# Patient Record
Sex: Female | Born: 1985 | Marital: Married | Smoking: Never smoker
Health system: Southern US, Community
[De-identification: ages and names within clinical notes are randomized; demographics above are authoritative.]

## PROBLEM LIST (undated history)

## (undated) DIAGNOSIS — Z789 Other specified health status: Secondary | ICD-10-CM

## (undated) HISTORY — DX: Other specified health status: Z78.9

---

## 2021-09-09 DIAGNOSIS — Z23 Encounter for immunization: Secondary | ICD-10-CM | POA: Diagnosis not present

## 2021-09-09 DIAGNOSIS — Z0289 Encounter for other administrative examinations: Secondary | ICD-10-CM | POA: Diagnosis not present

## 2021-09-09 DIAGNOSIS — Z111 Encounter for screening for respiratory tuberculosis: Secondary | ICD-10-CM | POA: Diagnosis not present

## 2021-09-09 DIAGNOSIS — Z114 Encounter for screening for human immunodeficiency virus [HIV]: Secondary | ICD-10-CM | POA: Diagnosis not present

## 2021-10-18 ENCOUNTER — Ambulatory Visit
Admission: RE | Admit: 2021-10-18 | Discharge: 2021-10-18 | Disposition: A | Discharge status: Home / Self Care | Payer: No Typology Code available for payment source | Source: Ambulatory Visit | Attending: Obstetrics and Gynecology | Admitting: Obstetrics and Gynecology

## 2021-10-18 ENCOUNTER — Other Ambulatory Visit: Payer: Self-pay

## 2021-10-18 ENCOUNTER — Other Ambulatory Visit: Payer: Self-pay | Admitting: Obstetrics and Gynecology

## 2021-10-18 DIAGNOSIS — R9389 Abnormal findings on diagnostic imaging of other specified body structures: Secondary | ICD-10-CM

## 2021-10-25 DIAGNOSIS — Z23 Encounter for immunization: Secondary | ICD-10-CM | POA: Diagnosis not present

## 2022-01-18 DIAGNOSIS — Z23 Encounter for immunization: Secondary | ICD-10-CM | POA: Diagnosis not present

## 2022-01-24 NOTE — Congregational Nurse Program (Unsigned)
Patient came in to establish care with PCP. Appointment made at same provider location as husband for April 22, 2022 10:20 am. Patients state they are comfortable taking the bus to appointment.  ? ?Michele Gomez H ? ?

## 2022-02-02 ENCOUNTER — Emergency Department (HOSPITAL_COMMUNITY): Payer: Medicaid Other

## 2022-02-02 ENCOUNTER — Encounter (HOSPITAL_COMMUNITY): Payer: Self-pay

## 2022-02-02 ENCOUNTER — Other Ambulatory Visit (HOSPITAL_COMMUNITY): Payer: Self-pay

## 2022-02-02 ENCOUNTER — Other Ambulatory Visit: Payer: Self-pay

## 2022-02-02 ENCOUNTER — Emergency Department (HOSPITAL_COMMUNITY)
Admission: EM | Admit: 2022-02-02 | Discharge: 2022-02-02 | Disposition: A | Discharge status: Home / Self Care | Payer: Medicaid Other | Attending: Emergency Medicine | Admitting: Emergency Medicine

## 2022-02-02 DIAGNOSIS — Y92410 Unspecified street and highway as the place of occurrence of the external cause: Secondary | ICD-10-CM | POA: Diagnosis not present

## 2022-02-02 DIAGNOSIS — R930 Abnormal findings on diagnostic imaging of skull and head, not elsewhere classified: Secondary | ICD-10-CM | POA: Insufficient documentation

## 2022-02-02 DIAGNOSIS — S59902A Unspecified injury of left elbow, initial encounter: Secondary | ICD-10-CM | POA: Diagnosis not present

## 2022-02-02 DIAGNOSIS — S79912A Unspecified injury of left hip, initial encounter: Secondary | ICD-10-CM | POA: Diagnosis not present

## 2022-02-02 DIAGNOSIS — R55 Syncope and collapse: Secondary | ICD-10-CM | POA: Insufficient documentation

## 2022-02-02 DIAGNOSIS — M79622 Pain in left upper arm: Secondary | ICD-10-CM | POA: Insufficient documentation

## 2022-02-02 DIAGNOSIS — S8992XA Unspecified injury of left lower leg, initial encounter: Secondary | ICD-10-CM | POA: Diagnosis not present

## 2022-02-02 DIAGNOSIS — M542 Cervicalgia: Secondary | ICD-10-CM | POA: Diagnosis not present

## 2022-02-02 DIAGNOSIS — M25552 Pain in left hip: Secondary | ICD-10-CM | POA: Diagnosis not present

## 2022-02-02 DIAGNOSIS — R519 Headache, unspecified: Secondary | ICD-10-CM | POA: Diagnosis present

## 2022-02-02 DIAGNOSIS — S3993XA Unspecified injury of pelvis, initial encounter: Secondary | ICD-10-CM | POA: Diagnosis not present

## 2022-02-02 DIAGNOSIS — S199XXA Unspecified injury of neck, initial encounter: Secondary | ICD-10-CM | POA: Diagnosis not present

## 2022-02-02 DIAGNOSIS — S299XXA Unspecified injury of thorax, initial encounter: Secondary | ICD-10-CM | POA: Diagnosis not present

## 2022-02-02 DIAGNOSIS — M545 Low back pain, unspecified: Secondary | ICD-10-CM | POA: Diagnosis not present

## 2022-02-02 DIAGNOSIS — S0990XA Unspecified injury of head, initial encounter: Secondary | ICD-10-CM | POA: Diagnosis not present

## 2022-02-02 DIAGNOSIS — M25562 Pain in left knee: Secondary | ICD-10-CM | POA: Insufficient documentation

## 2022-02-02 DIAGNOSIS — S3992XA Unspecified injury of lower back, initial encounter: Secondary | ICD-10-CM | POA: Diagnosis not present

## 2022-02-02 DIAGNOSIS — S4992XA Unspecified injury of left shoulder and upper arm, initial encounter: Secondary | ICD-10-CM | POA: Diagnosis not present

## 2022-02-02 LAB — I-STAT CHEM 8, ED
BUN: 10 mg/dL (ref 6–20)
Calcium, Ion: 1.17 mmol/L (ref 1.15–1.40)
Chloride: 105 mmol/L (ref 98–111)
Creatinine, Ser: 0.6 mg/dL (ref 0.44–1.00)
Glucose, Bld: 96 mg/dL (ref 70–99)
HCT: 39 % (ref 36.0–46.0)
Hemoglobin: 13.3 g/dL (ref 12.0–15.0)
Potassium: 3.9 mmol/L (ref 3.5–5.1)
Sodium: 140 mmol/L (ref 135–145)
TCO2: 24 mmol/L (ref 22–32)

## 2022-02-02 LAB — I-STAT BETA HCG BLOOD, ED (MC, WL, AP ONLY): I-stat hCG, quantitative: <5 m[IU]/mL (ref <5)

## 2022-02-02 MED ORDER — IBUPROFEN 600 MG PO TABS
600.0000 mg | ORAL_TABLET | Freq: Four times a day (QID) | ORAL | 0 refills | Status: DC | PRN
  Filled 2022-02-02: qty 30, 8d supply, fill #0

## 2022-02-02 MED ORDER — IBUPROFEN 800 MG PO TABS
800.0000 mg | ORAL_TABLET | Freq: Once | ORAL | Status: AC
Start: 2022-02-02 — End: 2022-02-02
  Administered 2022-02-02: 800 mg via ORAL
  Filled 2022-02-02: qty 1

## 2022-02-02 MED ORDER — ACETAMINOPHEN 325 MG PO TABS
650.0000 mg | ORAL_TABLET | Freq: Once | ORAL | Status: AC
  Administered 2022-02-02: 650 mg via ORAL
  Filled 2022-02-02: qty 2

## 2022-02-02 MED ORDER — ACETAMINOPHEN 500 MG PO TABS
500.0000 mg | ORAL_TABLET | Freq: Four times a day (QID) | ORAL | 0 refills | Status: DC | PRN
  Filled 2022-02-02: qty 30, 8d supply, fill #0
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
## 2022-02-02 NOTE — Discharge Planning (Signed)
Michele Gomez J. [name], RN, BSN, [place] [phone] ? ?RNCM set up appointment with [name] on 6/20 @1045 .  Spoke with pt at bedside and advised to please arrive 15 min early and take a picture ID and your current medications.  Pt verbalizes understanding of keeping appointment. ? ?
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
## 2022-02-02 NOTE — ED Notes (Signed)
Patient transported to CT 

## 2022-02-02 NOTE — ED Notes (Signed)
Patient remains in CT 

## 2022-02-02 NOTE — ED Notes (Signed)
Pt was able to ambulate to the restroom.  
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
## 2022-02-02 NOTE — ED Notes (Signed)
Ambulatory to bathroom. Gait steady. No signs of distress. PO fluids tolerated well ? ?

## 2022-02-02 NOTE — Discharge Instructions (Addendum)
Return immediately if she becomes less responsive, has increased headache, weakness, or other new symptoms. ?Please keep appointment with Dr. Wynetta Emery as scheduled ?
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
## 2022-02-03 ENCOUNTER — Telehealth: Payer: Self-pay

## 2022-02-03 NOTE — Telephone Encounter (Signed)
Transition Care Management Unsuccessful Follow-up Telephone Call ? ?Date of discharge and from where:  02/02/2022-[place]  ? ?Attempts:  1st Attempt ? ?Reason for unsuccessful TCM follow-up call:  Left voice message ? ?  ?

## 2022-02-04 NOTE — Telephone Encounter (Signed)
Transition Care Management Unsuccessful Follow-up Telephone Call ? ?Date of discharge and from where:  02/02/2022-[place]  ? ?Attempts:  2nd Attempt ? ?Reason for unsuccessful TCM follow-up call:  Left voice message ? ?  ?

## 2022-02-08 NOTE — Telephone Encounter (Signed)
Transition Care Management Unsuccessful Follow-up Telephone Call ? ?Date of discharge and from where:  02/02/2022-[place]  ? ?Attempts:  3rd Attempt ? ?Reason for unsuccessful TCM follow-up call:  Unable to reach patient ? ? ? ?

## 2022-02-10 ENCOUNTER — Other Ambulatory Visit (HOSPITAL_COMMUNITY): Payer: Self-pay
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
## 2022-04-22 ENCOUNTER — Ambulatory Visit (INDEPENDENT_AMBULATORY_CARE_PROVIDER_SITE_OTHER): Payer: Medicaid Other | Admitting: Family

## 2022-04-22 ENCOUNTER — Encounter: Payer: Self-pay | Admitting: Family

## 2022-04-22 ENCOUNTER — Other Ambulatory Visit (HOSPITAL_COMMUNITY): Payer: Self-pay

## 2022-04-22 ENCOUNTER — Other Ambulatory Visit: Payer: Self-pay

## 2022-04-22 VITALS — BP 135/86 | HR 108 | Temp 98.3°F | Resp 16 | Ht 64.45 in | Wt 124.7 lb

## 2022-04-22 DIAGNOSIS — Z7689 Persons encountering health services in other specified circumstances: Secondary | ICD-10-CM | POA: Diagnosis not present

## 2022-04-22 DIAGNOSIS — M792 Neuralgia and neuritis, unspecified: Secondary | ICD-10-CM | POA: Diagnosis not present

## 2022-04-22 DIAGNOSIS — R930 Abnormal findings on diagnostic imaging of skull and head, not elsewhere classified: Secondary | ICD-10-CM

## 2022-04-22 DIAGNOSIS — K219 Gastro-esophageal reflux disease without esophagitis: Secondary | ICD-10-CM

## 2022-04-22 DIAGNOSIS — M545 Low back pain, unspecified: Secondary | ICD-10-CM

## 2022-04-22 DIAGNOSIS — Z789 Other specified health status: Secondary | ICD-10-CM

## 2022-04-22 MED ORDER — OMEPRAZOLE 20 MG PO CPDR
20.0000 mg | DELAYED_RELEASE_CAPSULE | Freq: Every day | ORAL | 3 refills | Status: DC
  Filled 2022-04-22: qty 30, 30d supply, fill #0

## 2022-04-22 MED ORDER — OMEPRAZOLE 20 MG PO CPDR: 20.0000 mg | DELAYED_RELEASE_CAPSULE | Freq: Every day | ORAL | 3 refills | Status: DC

## 2022-04-22 MED ORDER — DULOXETINE HCL 20 MG PO CPEP
20.0000 mg | ORAL_CAPSULE | Freq: Every day | ORAL | 0 refills | Status: DC
  Filled 2022-04-22: qty 30, 30d supply, fill #0

## 2022-04-22 MED ORDER — DULOXETINE HCL 20 MG PO CPEP: 20.0000 mg | ORAL_CAPSULE | Freq: Every day | ORAL | 0 refills | Status: DC

## 2022-04-22 NOTE — Patient Instructions (Signed)
Thank you for choosing Primary Care at [facility] for your medical home!    Michele Gomez was seen by Rema Fendt, NP today.   Jenkins Rouge Schwall's primary care provider is Ricky Stabs, NP.   For the best care possible,  you should try to see Ricky Stabs, NP whenever you come to office.   We look forward to seeing you again soon!  If you have any questions about your visit today,  please call us at [phone]  Or feel free to reach your provider via MyChart.    Keeping you healthy   Get these tests Blood pressure- Have your blood pressure checked once a year by your healthcare provider.  Normal blood pressure is 120/80. Weight- Have your body mass index (BMI) calculated to screen for obesity.  BMI is a measure of body fat based on height and weight. You can also calculate your own BMI at https://www.west-esparza.com/. Cholesterol- Have your cholesterol checked regularly starting at age 71, sooner may be necessary if you have diabetes, high blood pressure, if a family member developed heart diseases at an early age or if you smoke.  Chlamydia, HIV, and other sexual transmitted disease- Get screened each year until the age of 26 then within three months of each new sexual partner. Diabetes- Have your blood sugar checked regularly if you have high blood pressure, high cholesterol, a family history of diabetes or if you are overweight.   Get these vaccines Flu shot- Every fall. Tetanus shot- Every 10 years. Menactra- Single dose; prevents meningitis.   Take these steps Don't smoke- If you do smoke, ask your healthcare provider about quitting. For tips on how to quit, go to www.smokefree.gov or call 1-800-QUIT-NOW. Be physically active- Exercise 5 days a week for at least 30 minutes.  If you are not already physically active start slow and gradually work up to 30 minutes of moderate physical activity.  Examples of moderate activity include walking briskly, mowing the yard, dancing,  swimming bicycling, etc. Eat a healthy diet- Eat a variety of healthy foods such as fruits, vegetables, low fat milk, low fat cheese, yogurt, lean meats, poultry, fish, beans, tofu, etc.  For more information on healthy eating, go to www.thenutritionsource.org Drink alcohol in moderation- Limit alcohol intake two drinks or less a day.  Never drink and drive. Dentist- Brush and floss teeth twice daily; visit your dentis twice a year. Depression-Your emotional health is as important as your physical health.  If you're feeling down, losing interest in things you normally enjoy please talk with your healthcare provider. Gun Safety- If you keep a gun in your home, keep it unloaded and with the safety lock on.  Bullets should be stored separately. Helmet use- Always wear a helmet when riding a motorcycle, bicycle, rollerblading or skateboarding. Safe sex- If you may be exposed to a sexually transmitted infection, use a condom Seat belts- Seat bels can save your life; always wear one. Smoke/Carbon Monoxide detectors- These detectors need to be installed on the appropriate level of your home.  Replace batteries at least once a year. Skin Cancer- When out in the sun, cover up and use sunscreen SPF 15 or higher. Violence- If anyone is threatening or hurting you, please tell your healthcare provider.

## 2022-04-22 NOTE — Progress Notes (Signed)
Pt presents to establish care, having issues w/ acid reflux  Having complaints of burning heels of feet and pain when walking

## 2022-05-11 ENCOUNTER — Telehealth: Payer: Self-pay | Admitting: Family

## 2022-05-17 NOTE — Progress Notes (Signed)
Patient ID: Michele Gomez, female    DOB: July 04, 1986  MRN: [id number]  CC: Annual Physical Exam  Subjective: Michele Gomez is a 36 y.o. female who presents for annual physical exam.  Her concerns today include:  None.    There are no problems to display for this patient.    Current Outpatient Medications on File Prior to Visit  Medication Sig Dispense Refill   acetaminophen (TYLENOL) 500 MG tablet Take 1 tablet (500 mg total) by mouth every 6 (six) hours as needed. 30 tablet 0   DULoxetine (CYMBALTA) 20 MG capsule Take 1 capsule (20 mg total) by mouth daily. 30 capsule 0   ibuprofen (ADVIL) 600 MG tablet Take 1 tablet (600 mg total) by mouth every 6 (six) hours as needed. 30 tablet 0   omeprazole (PRILOSEC) 20 MG capsule Take 1 capsule (20 mg total) by mouth daily. 30 capsule 3   No current facility-administered medications on file prior to visit.    No Known Allergies  Social History   Socioeconomic History   Marital status: Married    Spouse name: Not on file   Number of children: Not on file   Years of education: Not on file   Highest education level: Not on file  Occupational History   Not on file  Tobacco Use   Smoking status: Never    Passive exposure: Never   Smokeless tobacco: Never  Substance and Sexual Activity   Alcohol use: Never   Drug use: Never   Sexual activity: Yes  Other Topics Concern   Not on file  Social History Narrative   Not on file   Social Determinants of Health   Financial Resource Strain: Not on file  Food Insecurity: Not on file  Transportation Needs: Not on file  Physical Activity: Not on file  Stress: Not on file  Social Connections: Not on file  Intimate Partner Violence: Not on file    No family history on file.  No past surgical history on file.  ROS: Review of Systems Negative except as stated above  PHYSICAL EXAM: BP 137/88 (BP Location: Right Arm, Patient Position: Sitting, Cuff Size:  Normal)   Pulse 82   Temp 98.3 F (36.8 C)   Resp 16   Ht 5' 4.45" (1.637 m)   Wt 123 lb (55.8 kg)   SpO2 98%   BMI 20.82 kg/m   Physical Exam HENT:     Head: Normocephalic and atraumatic.     Right Ear: Tympanic membrane, ear canal and external ear normal.     Left Ear: Tympanic membrane, ear canal and external ear normal.     Nose: Nose normal.     Mouth/Throat:     Mouth: Mucous membranes are moist.     Pharynx: Oropharynx is clear.  Eyes:     Extraocular Movements: Extraocular movements intact.     Conjunctiva/sclera: Conjunctivae normal.     Pupils: Pupils are equal, round, and reactive to light.  Cardiovascular:     Rate and Rhythm: Normal rate and regular rhythm.     Pulses: Normal pulses.     Heart sounds: Normal heart sounds.  Pulmonary:     Effort: Pulmonary effort is normal.     Breath sounds: Normal breath sounds.  Chest:     Comments: Patient declined.  Abdominal:     General: Bowel sounds are normal.     Palpations: Abdomen is soft.  Genitourinary:    General: Normal vulva.  Vagina: Normal.     Cervix: Normal.     Uterus: Normal.      Adnexa: Right adnexa normal and left adnexa normal.     Comments: Elmon Else, CMA present. Musculoskeletal:        General: Normal range of motion.     Right shoulder: Normal.     Left shoulder: Normal.     Right upper arm: Normal.     Left upper arm: Normal.     Right elbow: Normal.     Left elbow: Normal.     Right forearm: Normal.     Left forearm: Normal.     Right wrist: Normal.     Left wrist: Normal.     Right hand: Normal.     Left hand: Normal.     Cervical back: Normal, normal range of motion and neck supple.     Thoracic back: Normal.     Lumbar back: Normal.     Right hip: Normal.     Left hip: Normal.     Right upper leg: Normal.     Left upper leg: Normal.     Right knee: Normal.     Left knee: Normal.     Right lower leg: Normal.     Left lower leg: Normal.     Right ankle: Normal.      Left ankle: Normal.     Right foot: Normal.     Left foot: Normal.  Skin:    General: Skin is warm and dry.     Capillary Refill: Capillary refill takes less than 2 seconds.  Neurological:     General: No focal deficit present.     Mental Status: She is alert and oriented to person, place, and time.  Psychiatric:        Mood and Affect: Mood normal.        Behavior: Behavior normal.     ASSESSMENT AND PLAN: 1. Annual physical exam - Counseled on 150 minutes of exercise per week as tolerated, healthy eating (including decreased daily intake of saturated fats, cholesterol, added sugars, sodium), STI prevention, and routine healthcare maintenance.  2. Screening for metabolic disorder - BMP to evaluate kidney function and electrolyte balance. - Basic Metabolic Panel  3. Diabetes mellitus screening - Hemoglobin A1c to screen for pre-diabetes/diabetes. - Hemoglobin A1c  4. Screening cholesterol level - Lipid panel to screen for high cholesterol.  - Lipid panel  5. Thyroid disorder screen - TSH to check thyroid function.  - TSH  6. Pap smear for cervical cancer screening - Cytology - PAP for cervical cancer screening.  - Cytology - PAP([place])  7. Routine screening for STI (sexually transmitted infection) - Cervicovaginal self-swab to screen for chlamydia, gonorrhea, trichomonas, bacterial vaginitis, and candida vaginitis. - Cervicovaginal ancillary only  8. Need for hepatitis C screening test - Hepatitis C antibody to screen for hepatitis C.  - Hepatitis C Antibody  9. Encounter for screening for HIV - HIV antibody to screen for human immunodeficiency virus.  - HIV antibody (with reflex)  10. Language barrier - Building control surveyor. Name: Lynder Parents  ID#: [id number]    Patient was given the opportunity to ask questions.  Patient verbalized understanding of the plan and was able to repeat key elements of the plan. Patient was given clear instructions to go to  Emergency Department or return to medical center if symptoms don't improve, worsen, or new problems develop.The patient verbalized understanding.   Orders Placed This Encounter  Procedures   HIV antibody (with reflex)   Hepatitis C Antibody   Basic Metabolic Panel   Lipid panel   TSH   Hemoglobin A1c    Return in about 1 year (around 05/27/2023) for Physical per patient preference.  Rema Fendt, NP

## 2022-05-26 ENCOUNTER — Encounter: Payer: Self-pay | Admitting: Family

## 2022-05-26 ENCOUNTER — Other Ambulatory Visit (HOSPITAL_COMMUNITY)
Admission: RE | Admit: 2022-05-26 | Discharge: 2022-05-26 | Disposition: A | Discharge status: Home / Self Care | Payer: Medicaid Other | Source: Ambulatory Visit | Attending: Family | Admitting: Family

## 2022-05-26 ENCOUNTER — Ambulatory Visit (INDEPENDENT_AMBULATORY_CARE_PROVIDER_SITE_OTHER): Payer: Medicaid Other | Admitting: Family

## 2022-05-26 VITALS — BP 137/88 | HR 82 | Temp 98.3°F | Resp 16 | Ht 64.45 in | Wt 123.0 lb

## 2022-05-26 DIAGNOSIS — Z113 Encounter for screening for infections with a predominantly sexual mode of transmission: Secondary | ICD-10-CM | POA: Insufficient documentation

## 2022-05-26 DIAGNOSIS — Z124 Encounter for screening for malignant neoplasm of cervix: Secondary | ICD-10-CM | POA: Insufficient documentation

## 2022-05-26 DIAGNOSIS — Z131 Encounter for screening for diabetes mellitus: Secondary | ICD-10-CM

## 2022-05-26 DIAGNOSIS — Z114 Encounter for screening for human immunodeficiency virus [HIV]: Secondary | ICD-10-CM

## 2022-05-26 DIAGNOSIS — Z789 Other specified health status: Secondary | ICD-10-CM

## 2022-05-26 DIAGNOSIS — Z1159 Encounter for screening for other viral diseases: Secondary | ICD-10-CM

## 2022-05-26 DIAGNOSIS — Z0000 Encounter for general adult medical examination without abnormal findings: Secondary | ICD-10-CM

## 2022-05-26 DIAGNOSIS — Z758 Other problems related to medical facilities and other health care: Secondary | ICD-10-CM

## 2022-05-26 DIAGNOSIS — Z1329 Encounter for screening for other suspected endocrine disorder: Secondary | ICD-10-CM

## 2022-05-26 DIAGNOSIS — Z13220 Encounter for screening for lipoid disorders: Secondary | ICD-10-CM

## 2022-05-26 DIAGNOSIS — Z13228 Encounter for screening for other metabolic disorders: Secondary | ICD-10-CM

## 2022-05-26 NOTE — Patient Instructions (Addendum)
??????? ???????? ?????? ?? ??? 37-39 ????? [facility]   Address: [place] [address] Phone#: [phone]  Address: [address]  Phone #: [phone]  Preventive Care 37-37 Years Old, Female ???? ????? ??????? ???????? ??? ???????? ??? ?????? ??????? ???????? ?? ???? ??????? ?????? ??? ?? ???? ???????? ?????? ????????. ?????? ?????? ??????? ???????? ????? ?????? ??????? ????? ??????. ???? ???? ????? ??????? ????????? ?????????? ???? ????? ??????? ????????? ?? ?????? ???? ??????? ?????? ??????? ??? ??:  ??????? ??????? ??? ?? ???: ? ???????? ?????? ???? ????? ??? ?? ???. ? ??????? ?????? ????????. ? ????? ????? ???????.  ?????? ?????? ???????? ??? ?? ???: ? ???? ?????. ? ??? ????????? ????????. ? ??????? ???????? ????????. ? ?????? ??????? ??????? ??????? ????. ? ?????? ?????? ?????? ???????.  ????? ???????? ??? ?? ???: ? ????? ????????? ???????? ?? ????????? ?? ????? ?? ????????. ? ??????? ?????? ??? ??????? ???????. ? ?????? ??????? ????????? ?????? ????? ??????. ? ????? ??????. ? ??????? ?????? ??????? ?? ?????. ? ???????? ???? ????? ???????? ??? ??????? ???? ?????? ????? ???????. ????? ???????? ?? ???? ???? ??????? ?????? ????? ?? ???:  ????? ??????. ????? ??????? ???? ???????? ????? ???? ???? ????? (BMI). ????? ???? ????? (BMI) ????? ?????? ?? ????? ?? ?????? ?????? ?? ??.  ???? ?????. ?? ???? ?? ?????. ????? ??? ?????? ????? ?? ??? ??? ???? ?? ?????? ?????? ?? ??? ??? ????? ?? ?????? ????????? ??????? ?????? ?????? ??? ?????? ?? ????? ?????? ??????? ??? ????.  ???? ????? ????? ???? ????.  ???? ????? ?????.  ????? ??????? ??? ??? ??????. ?? ????????? ???? ????????  ????? ?? ???? ?? ??? ???? ??? ???? ???? ?????. ?????? ??????? ??????? ?????? ??????? ??? ????????? ??? ????? ??????? ????? ?????? ?????? ???? ??????? ?????? ??? ????? ?? ???? ?????. ?? ???????? ???? ????? ??? ???????? ?????? ?? ???? ????? ??????? ?????? ?????? ???? ?????  ?????? ?? ?????? ???? ?????. ????? ???? ???:  ??? ????? ??????? ???.  ??????? ?????? ????????????.  ???? ??????. ????? ??? ???? ??? ???? (????????) ??? ???????? ?? ????? ?????? ????? ????? (????).  ????? ?????? ????? ??????? "?".  ????? ?????? ????? ??????? "?".  ????? ????? ?? ????? ??? ??????? ?????? (HIV).  ???? ????? ?? ?????? ???????? ??? ??????? ?????? (STI)? ??? ???? ?? ???????? ???? ??????? ???? ??????? ?? ??????.  ???? ????? ?? ??????? ??? ????? ?? BRCA. ????? ????? ??? ????? ??? ??? ????? ????? ???? ?? ??????? ??????? ?????? ????? ?? ?????? ?? ????? ????? ?? ?????? ?????????. ????? ??? ???? ??????? ?????? ??????? ??????? ??????? ??????? ???? ???????? ????? ?????? ??? ????? ?????? ?? ??????. ????? ??? ????????? ?? ??????: ????? ??????   ????? ?????? ??????? ????? ????? ??? ??????? ?????????? ??????? ??????? ??????? ??????????? ????? ?????? ??????? ??????? ????? ?????.  ?????? ??????????? ???????? ??? ?????? ???? ??????? ?????? ??????? ???.  ?? ??????? ????????? ??????? ?? ??????? ???????: ? ??? ?????? ???? ??????? ?????? ??????? ?????? ???? ????? ?????????. ? ??? ???? ??????? ?? ?????? ?????? ?? ??????? ???? ?? ?????? ??????.  ??????? ?????? ????????? ????????: ? ?????? ?? ????? ??? ????????? ?????? ?? ?????? ??????? ?????? ??? ??????.  ? ????? ???? ?????? ?? ?? ????? ???????. ?? ???????? ???????? ????? ??????? ?????? ???? ??? 12 ????? (355 ??) ?? ?????? ?? ??? ??? 5 ?????? (148 ??) ?? ?????? ?? ??? ??? ????? ???? (44 ??) ?? ????????? ???????? ??????. ??? ??????  ????? ??????? ???????? ?????? ????? ??? ????????? ?????? ??????. ??????? ???? ??????? ??? ????? ??????.  ????? ???????? ????  30 ????? ??? ????? ????? 5 ???? ?? ???? ?? ?????.  ?? ??????? ?????? ????? ??? ????????? ?? ?????. ????? ??? ??????? ???? ????? ?????? ??????? ???????????? ??? ??????? ???????????. ??????? ???? ??????? ?????? ??? ???? ????? ??? ???????? ??????? ?? ???????.  ?? ?????? ????????.  ?????? ????????  ??????? ?? ???? ?????? ?????. ????? ??? ??????? ?????? ?????? ?? ????? ???? ?? ????? ??????? ??????? ?? ??????? ???????? ?????? (STIs).  ??? ???? ?? ?????? ?? ?????? ???????? ????? ?????? ?????. ??? ???? ?????? ?????? ?????? ?????? ?? ???? ??????? ?????? ??????? ??? ????????? ????? ?????.  ????? ?????? ??? ?????? ???? ?????? ??? ??????? ??????? ???: ? ?????? ?????? ?? ?????? ?? ???????? ??? ????????. ? ????? ????????. ? ?????? ??? ??? ????? ??. ? ???? ??? ????? ?? ??????? ??????.  ???? ?????? ?????? ??? ????????? ??????? ?? ????? ?????. ???????  ????? ??? ?????? ???? ?????? ?? ????? ????? ??????? ?? ???? ???????.  ?? ?????? ???????: ? ??? ????? ????????? ????????. ?? ????? ??????? ?? ??? ??? ???? ????????? ??? ???????. ? ??? ????? ?? ???? ?? ?????? ???? ??? ?????. ? ?????? ???????? ??????? ??????. ? ????? ????? ????? ?? ????? ?????.  ????? ??? ?????? ???? ?????? ?? ??????? ???????? ?? ????? ??????? ????????.  ??? ??? ????? ???? ???? ?? ??????? ????? ?? ????? ??????? ??????? ?????? ???????.  ????? ???????? ??? ???? ??????? ?????? ???? ?? ????. ?? ?????? ????????  ??????? ???? ??????? ?????? ??????? ?? ??? ????? ?????? ?????? ??? ???????.  ????? ???? ??????? ?????? ?? ??? ????? ????? ?????? ???? ?????? ????????.  ????? ??? ?????? ??? ???? ????????? ?? ????????. ??? ????? ?? ??? ????????? ?? ???? ?????? ????????? ???? ?????? ???? ??????? ??????. ???? ?? ?????? ??? ????? ???? ?? ???? ?? ???? ??????? ??????.? Document Revised: 04/07/2021 Document Reviewed: 04/07/2021 Elsevier Patient Education  [place].

## 2022-05-26 NOTE — Progress Notes (Signed)
.  Pt presents for annual physical exam w/pap 

## 2022-05-27 ENCOUNTER — Other Ambulatory Visit: Payer: Self-pay | Admitting: Family

## 2022-05-27 DIAGNOSIS — Z13228 Encounter for screening for other metabolic disorders: Secondary | ICD-10-CM

## 2022-05-27 LAB — CERVICOVAGINAL ANCILLARY ONLY
Bacterial Vaginitis (gardnerella): NEGATIVE
Candida Glabrata: NEGATIVE
Candida Vaginitis: NEGATIVE
Chlamydia: NEGATIVE
Comment: NEGATIVE
Comment: NEGATIVE
Comment: NEGATIVE
Comment: NEGATIVE
Comment: NEGATIVE
Comment: NORMAL
Neisseria Gonorrhea: NEGATIVE
Trichomonas: NEGATIVE

## 2022-05-27 LAB — HEMOGLOBIN A1C
Est. average glucose Bld gHb Est-mCnc: 108 mg/dL
Hgb A1c MFr Bld: 5.4 % (ref 4.8–5.6)

## 2022-05-27 LAB — HEPATITIS C ANTIBODY: Hep C Virus Ab: NONREACTIVE

## 2022-05-27 LAB — BASIC METABOLIC PANEL
BUN/Creatinine Ratio: 14 (ref 9–23)
BUN: 7 mg/dL (ref 6–20)
CO2: 22 mmol/L (ref 20–29)
Calcium: 9.4 mg/dL (ref 8.7–10.2)
Chloride: 105 mmol/L (ref 96–106)
Creatinine, Ser: 0.49 mg/dL — ABNORMAL LOW (ref 0.57–1.00)
Glucose: 85 mg/dL (ref 70–99)
Potassium: 4 mmol/L (ref 3.5–5.2)
Sodium: 141 mmol/L (ref 134–144)
eGFR: 125 mL/min/{1.73_m2} (ref >59)

## 2022-05-27 LAB — LIPID PANEL
Chol/HDL Ratio: 2.2 ratio (ref 0.0–4.4)
Cholesterol, Total: 128 mg/dL (ref 100–199)
HDL: 57 mg/dL (ref >39)
LDL Chol Calc (NIH): 59 mg/dL (ref 0–99)
Triglycerides: 51 mg/dL (ref 0–149)
VLDL Cholesterol Cal: 12 mg/dL (ref 5–40)

## 2022-05-27 LAB — HIV ANTIBODY (ROUTINE TESTING W REFLEX): HIV Screen 4th Generation wRfx: NONREACTIVE

## 2022-05-27 LAB — TSH: TSH: 0.835 u[IU]/mL (ref 0.450–4.500)

## 2022-06-02 ENCOUNTER — Telehealth: Payer: Self-pay | Admitting: Family

## 2022-06-02 ENCOUNTER — Other Ambulatory Visit: Payer: Self-pay | Admitting: Family

## 2022-06-02 DIAGNOSIS — R87610 Atypical squamous cells of undetermined significance on cytologic smear of cervix (ASC-US): Secondary | ICD-10-CM

## 2022-06-02 LAB — CYTOLOGY - PAP
Comment: NEGATIVE
Diagnosis: UNDETERMINED — AB
High risk HPV: NEGATIVE

## 2022-06-02 LAB — HEPATIC FUNCTION PANEL
ALT: 12 IU/L (ref 0–32)
AST: 18 IU/L (ref 0–40)
Albumin: 4.7 g/dL (ref 3.9–4.9)
Alkaline Phosphatase: 61 IU/L (ref 44–121)
Bilirubin Total: <0.2 mg/dL (ref 0.0–1.2)
Bilirubin, Direct: <0.1 mg/dL (ref 0.00–0.40)
Total Protein: 7.3 g/dL (ref 6.0–8.5)

## 2022-06-02 LAB — SPECIMEN STATUS REPORT

## 2022-06-02 NOTE — Telephone Encounter (Signed)
Pt is scheduled °

## 2022-06-02 NOTE — Telephone Encounter (Signed)
Copied from CRM [id number]. Topic: Appointment Scheduling - Scheduling Inquiry for Clinic >> Jun 02, 2022 11:34 AM Franchot Heidelberg wrote: Reason for CRM: [facility] coordinator wants patient to get any vaccines that she is needing with her PCP   Best contact: [phone]   *Does pt need immunizations and if so, does it need to be an office visit or nurse visit?

## 2022-06-09 ENCOUNTER — Ambulatory Visit: Payer: Medicaid Other

## 2022-06-09 ENCOUNTER — Ambulatory Visit (INDEPENDENT_AMBULATORY_CARE_PROVIDER_SITE_OTHER): Payer: Medicaid Other

## 2022-06-09 DIAGNOSIS — Z23 Encounter for immunization: Secondary | ICD-10-CM

## 2022-07-20 ENCOUNTER — Ambulatory Visit: Payer: No Typology Code available for payment source

## 2022-07-25 ENCOUNTER — Ambulatory Visit (INDEPENDENT_AMBULATORY_CARE_PROVIDER_SITE_OTHER): Payer: Self-pay | Admitting: Obstetrics and Gynecology

## 2022-07-25 DIAGNOSIS — Z3201 Encounter for pregnancy test, result positive: Secondary | ICD-10-CM

## 2022-07-25 DIAGNOSIS — Z3200 Encounter for pregnancy test, result unknown: Secondary | ICD-10-CM

## 2022-07-25 LAB — POCT PREGNANCY, URINE: Preg Test, Ur: POSITIVE — AB

## 2022-07-25 NOTE — Patient Instructions (Addendum)
Prenatal Care Providers           [facility] [place] @ [place] [facility]  [place] [phone]  Center for [facility] @ [place]  [phone]  [place] @ [facility]       [address] [phone]            [facility] for [place] @ [place]     [phone]          [facility] for [place] @ [facility]   [place] [phone]  [facility] for [place] @ [place] [phone]     [facility] for [place] @ [address] Linna Hoff)  [place]   [phone]     [place] [facility]  Phone: [facility]  Phone: [facility] Phone: [phone]  [facility] for [facility] Phone: [phone]  [facility] Phone: [phone]  [facility] Phone: [phone]  [facility]  Phone: [phone]    You should start Prenatal vitamins as soon as you can

## 2022-07-25 NOTE — Progress Notes (Signed)
Patient here for pregnancy test which was positive. She confirms sure LMP of 05/16/22 which makes her 10wk and EDD 02/20/23. I advised her to start prenatal care with provider of her choice and to start PNV. Meds reviewed. She has had 3 children , states has edema in last few weeks and high blood pressure at end of pregnancy during time of surgery. States had C/S with each pregnancy and states first C/S due to macrosomia and nuchal cord. She states concerned because she had 2 vaccines for Green card on 06/09/22 and did not know she was pregnant. Dr. Ilda Basset in to greet patient as she is new to our practice. Sent to desk to schedule new ob. Staci Acosta

## 2022-08-04 ENCOUNTER — Other Ambulatory Visit (HOSPITAL_COMMUNITY)
Admission: RE | Admit: 2022-08-04 | Discharge: 2022-08-04 | Disposition: A | Discharge status: Home / Self Care | Payer: Medicaid Other | Source: Ambulatory Visit | Attending: Family Medicine | Admitting: Family Medicine

## 2022-08-04 ENCOUNTER — Ambulatory Visit (INDEPENDENT_AMBULATORY_CARE_PROVIDER_SITE_OTHER): Payer: Self-pay

## 2022-08-04 VITALS — BP 123/85 | HR 89 | Wt 127.0 lb

## 2022-08-04 DIAGNOSIS — O09529 Supervision of elderly multigravida, unspecified trimester: Secondary | ICD-10-CM | POA: Insufficient documentation

## 2022-08-04 DIAGNOSIS — O0990 Supervision of high risk pregnancy, unspecified, unspecified trimester: Secondary | ICD-10-CM | POA: Insufficient documentation

## 2022-08-04 DIAGNOSIS — Z98891 History of uterine scar from previous surgery: Secondary | ICD-10-CM | POA: Insufficient documentation

## 2022-08-04 DIAGNOSIS — O09521 Supervision of elderly multigravida, first trimester: Secondary | ICD-10-CM

## 2022-08-04 MED ORDER — PRENATAL 27-1 MG PO TABS
1.0000 | ORAL_TABLET | Freq: Every day | ORAL | 11 refills | Status: DC
  Filled 2022-08-09: qty 30, 30d supply, fill #0
  Filled 2022-09-06: qty 30, 30d supply, fill #1
  Filled 2022-10-04: qty 30, 30d supply, fill #2
  Filled 2022-11-09: qty 90, 90d supply, fill #3

## 2022-08-04 NOTE — Progress Notes (Signed)
New OB Intake  Patient came in person for her new ob intake. CAP Arabic interpreter [facility] assist with the visit.   I discussed the limitations, risks, security and privacy concerns of performing an evaluation and management service by telephone and the availability of in person appointments. I also discussed with the patient that there may be a patient responsible charge related to this service. The patient expressed understanding and agreed to proceed.  I explained I am completing New OB Intake today. We discussed EDD of 02/20/2023 that is based on LMP of 05/16/2022. Pt is G4/P3. I reviewed her allergies, medications, Medical/Surgical/OB history, and appropriate screenings. I informed her of [facility] services. [facility] information placed in AVS. Based on history, this is a high risk pregnancy.   Concerns addressed today Patient Active Problem List   Diagnosis Date Noted   Supervision of high risk pregnancy, antepartum 08/04/2022   History of cesarean section 08/04/2022   AMA (advanced maternal age) multigravida 35+ 08/04/2022     Delivery Plans Plans to deliver at [facility]. Patient given information for [facility] Healthy Baby website for more information about [facility]. Patient is not interested in water birth. Offered upcoming OB visit with CNM to discuss further.  MyChart/Babyscripts MyChart access verified. I explained pt will have some visits in office and some virtually. Babyscripts instructions given and order placed. Patient verifies receipt of registration text/e-mail. Account successfully created and app downloaded.  Blood Pressure Cuff/Weight Scale Will order patient a blood pressure and weight scale once Medicaid is approve.   Anatomy US Explained first scheduled [place] will be around 19 weeks. Anatomy US scheduled for 09/28/2022 at 8:30 AM. Pt notified to arrive at 8:15 AM.  Labs Patient had her initial ob labs, genetic screening, ob culture, GC/CH, HGB A1C drawn.    COVID Vaccine Patient has not had COVID vaccine.   Is patient a CenteringPregnancy candidate?  Not a candidate due to Language barrier    Is patient a Mom+Baby Combined Care candidate?  Not a candidate   If accepted, Mom+Baby staff notified  Social Determinants of Health Food Insecurity: Patient denies food insecurity. WIC Referral: Patient is interested in referral to [facility].  Transportation: Patient denies transportation needs. Childcare: Discussed no children allowed at ultrasound appointments. Offered childcare services; patient declines childcare services at this time.  First visit review I reviewed new OB appt with patient. I explained they will have a provider visit that includes AFP and may require pelvic exam. Explained pt will be seen by [name] MD at first visit; encounter routed to appropriate provider. Explained that patient will be seen by pregnancy navigator following visit with provider.   Vidal Schwalbe, [place] 08/04/2022  8:36 AM

## 2022-08-05 LAB — CBC/D/PLT+RPR+RH+ABO+RUBIGG...
Antibody Screen: NEGATIVE
Basophils Absolute: 0 10*3/uL (ref 0.0–0.2)
Basos: 0 %
EOS (ABSOLUTE): 0 10*3/uL (ref 0.0–0.4)
Eos: 0 %
HCV Ab: NONREACTIVE
HIV Screen 4th Generation wRfx: NONREACTIVE
Hematocrit: 38 % (ref 34.0–46.6)
Hemoglobin: 12.6 g/dL (ref 11.1–15.9)
Hepatitis B Surface Ag: NEGATIVE
Immature Grans (Abs): 0 10*3/uL (ref 0.0–0.1)
Immature Granulocytes: 0 %
Lymphocytes Absolute: 1.4 10*3/uL (ref 0.7–3.1)
Lymphs: 27 %
MCH: 29.9 pg (ref 26.6–33.0)
MCHC: 33.2 g/dL (ref 31.5–35.7)
MCV: 90 fL (ref 79–97)
Monocytes Absolute: 0.3 10*3/uL (ref 0.1–0.9)
Monocytes: 7 %
Neutrophils Absolute: 3.2 10*3/uL (ref 1.4–7.0)
Neutrophils: 66 %
Platelets: 246 10*3/uL (ref 150–450)
RBC: 4.21 x10E6/uL (ref 3.77–5.28)
RDW: 13.3 % (ref 11.7–15.4)
RPR Ser Ql: NONREACTIVE
Rh Factor: POSITIVE
Rubella Antibodies, IGG: 19.7 index (ref >0.99)
WBC: 5 10*3/uL (ref 3.4–10.8)

## 2022-08-05 LAB — HEMOGLOBIN A1C
Est. average glucose Bld gHb Est-mCnc: 114 mg/dL
Hgb A1c MFr Bld: 5.6 % (ref 4.8–5.6)

## 2022-08-05 LAB — GC/CHLAMYDIA PROBE AMP (~~LOC~~) NOT AT ARMC
Chlamydia: NEGATIVE
Comment: NEGATIVE
Comment: NORMAL
Neisseria Gonorrhea: NEGATIVE

## 2022-08-05 LAB — HCV INTERPRETATION

## 2022-08-06 LAB — CULTURE, OB URINE

## 2022-08-06 LAB — URINE CULTURE, OB REFLEX

## 2022-08-09 ENCOUNTER — Other Ambulatory Visit (HOSPITAL_COMMUNITY): Payer: Self-pay

## 2022-08-09 DIAGNOSIS — Z3491 Encounter for supervision of normal pregnancy, unspecified, first trimester: Secondary | ICD-10-CM

## 2022-08-09 LAB — PANORAMA PRENATAL TEST FULL PANEL:PANORAMA TEST PLUS 5 ADDITIONAL MICRODELETIONS: FETAL FRACTION: 8.2

## 2022-08-09 LAB — GLUCOSE, POCT (MANUAL RESULT ENTRY): POC Glucose: 96 mg/dl (ref 70–99)

## 2022-08-09 NOTE — Congregational Nurse Program (Signed)
  Dept: [phone]   Congregational Nurse Program Note  Date of Encounter: 08/09/2022  Past Medical History: Past Medical History:  Diagnosis Date   Medical history non-contributory     Encounter Details:  CNP Questionnaire - 08/09/22 1059       Questionnaire   Ask client: Do you give verbal consent for me to treat you today? Yes    Student Assistance [employer]    Location Patient Served  [facility]    Visit Setting with Client Church    Patient Status Refugee    Insurance [place]    Insurance/Financial Assistance Referral N/A    Medication Have Medication Insecurities;Provided Medication Assistance    Medical Provider Yes    Screening Referrals Made N/A    Medical Referrals Made N/A    Medical Appointment Made N/A    Recently w/o PCP, now 1st time PCP visit completed due to CNs referral or appointment made N/A    Food N/A    Transportation N/A    Housing/Utilities N/A    Interpersonal Safety N/A    Interventions Advocate/Support;Navigate Healthcare System;Case Management;Counsel;Educate    Abnormal to Normal Screening Since Last CN Visit N/A    Screenings CN Performed Blood Pressure;Blood Glucose    Sent Client to Lab for: N/A    Did client attend any of the following based off CNs referral or appointments made? N/A    ED Visit Averted Yes    Life-Saving Intervention Made N/A             Patient came in to establish care with Congregational RN and [employer] intern. Patient is pregnant in first trimester, currently not taking prenatal vitamins due to concern for pork ingredient in medicine. We have contacted pharmacy and they are looking for an option without pork. Blood sugar and blood pressure are within normal limits. Patient has transportation to go to doctor's appointments.    Nicole Cella Joyel Chenette RN BSN PCCN  [facility] [phone]-office

## 2022-08-10 ENCOUNTER — Other Ambulatory Visit (HOSPITAL_COMMUNITY): Payer: Self-pay

## 2022-08-14 LAB — HORIZON CUSTOM: REPORT SUMMARY: NEGATIVE

## 2022-08-15 ENCOUNTER — Telehealth: Payer: Self-pay | Admitting: *Deleted

## 2022-08-15 NOTE — Telephone Encounter (Signed)
I called patient with [facility] Interpre[employer] # [phone] and informed her negative results per Dr. Alysia Penna . She also wanted to know results of panorama results. She voices understanding . I also reviewed her new ob appointment.  Nancy Fetter

## 2022-08-15 NOTE — Telephone Encounter (Signed)
-----   Message from [name], MD sent at 08/15/2022 11:38 AM EST ----- Please let pt know that her Horizon genetic test is negative.  Thanks Casimiro Needle

## 2022-08-17 DIAGNOSIS — Z3492 Encounter for supervision of normal pregnancy, unspecified, second trimester: Secondary | ICD-10-CM

## 2022-08-17 DIAGNOSIS — Z3490 Encounter for supervision of normal pregnancy, unspecified, unspecified trimester: Secondary | ICD-10-CM

## 2022-08-17 LAB — GLUCOSE, POCT (MANUAL RESULT ENTRY): POC Glucose: 89 mg/dl (ref 70–99)

## 2022-08-17 NOTE — Congregational Nurse Program (Signed)
  Dept: [phone]   Congregational Nurse Program Note  Date of Encounter: 08/17/2022  Past Medical History: Past Medical History:  Diagnosis Date   Medical history non-contributory     Encounter Details:  CNP Questionnaire - 08/17/22 1142       Questionnaire   Ask client: Do you give verbal consent for me to treat you today? Yes    Student Assistance N/A    [facility] Served  [facility]    Visit Setting with Client [facility]    Patient Status Refugee    Insurance Medicaid    Insurance/Financial Assistance Referral N/A    Medication Have Medication Insecurities;Provided Medication Assistance    Medical Provider Yes    Screening Referrals Made N/A    Medical Referrals Made N/A    Medical Appointment Made N/A    Recently w/o PCP, now 1st time PCP visit completed due to CNs referral or appointment made N/A    Food N/A    Transportation N/A    Housing/Utilities N/A    Interpersonal Safety N/A    Interventions Advocate/Support;Navigate Healthcare System;Case Management;Counsel;Educate    Abnormal to Normal Screening Since Last CN Visit N/A    Screenings CN Performed Blood Pressure;Blood Glucose    Sent Client to Lab for: N/A    Did client attend any of the following based off CNs referral or appointments made? N/A    ED Visit Averted Yes    Life-Saving Intervention Made N/A            Patient came in for routine blood pressure and blood sugar monitoring. WNL.  Nicole Cella Kahlil Cowans RN BSN PCCN  [facility] [phone]-office

## 2022-08-24 DIAGNOSIS — R7309 Other abnormal glucose: Secondary | ICD-10-CM

## 2022-08-24 LAB — GLUCOSE, POCT (MANUAL RESULT ENTRY): POC Glucose: 138 mg/dl — AB (ref 70–99)

## 2022-08-24 NOTE — Congregational Nurse Program (Signed)
  Dept: [phone]   Congregational Nurse Program Note  Date of Encounter: 08/24/2022  Past Medical History: Past Medical History:  Diagnosis Date   Medical history non-contributory     Encounter Details:  CNP Questionnaire - 08/24/22 1138       Questionnaire   Ask client: Do you give verbal consent for me to treat you today? Yes    Student Assistance N/A    [facility] S[facility]    Visit Setting with Client Organization    Patient Status Refugee    Insurance Medicaid    Insurance/Financial Assistance Referral N/A    Medication Have Medication Insecurities;Provided Medication Assistance    Medical Provider Yes    Screening Referrals Made N/A    Medical Referrals Made N/A    Medical Appointment Made N/A    Recently w/o PCP, now 1st time PCP visit completed due to CNs referral or appointment made N/A    Food N/A    Transportation N/A    Housing/Utilities N/A    Interpersonal Safety N/A    Interventions Advocate/Support;Navigate Healthcare System;Case Management;Counsel;Educate    Abnormal to Normal Screening Since Last CN Visit Blood Glucose    Screenings CN Performed Blood Pressure;Blood Glucose    Sent Client to Lab for: N/A    Did client attend any of the following based off CNs referral or appointments made? N/A    ED Visit Averted Yes    Life-Saving Intervention Made N/A            Currently pregnant and came in for blood pressure and sugar check. Today blood sugar is slightly elevated to 138. Educated on heart healthy diabetic diet during pregnancy. I will continue to follow closely.  Nicole Cella Maanav Kassabian RN BSN PCCN  [facility] [phone]-office

## 2022-08-29 ENCOUNTER — Encounter: Payer: Self-pay | Admitting: Family

## 2022-08-30 ENCOUNTER — Other Ambulatory Visit (HOSPITAL_COMMUNITY): Payer: Self-pay

## 2022-08-30 ENCOUNTER — Other Ambulatory Visit: Payer: Self-pay

## 2022-08-30 ENCOUNTER — Encounter: Payer: Self-pay | Admitting: Obstetrics and Gynecology

## 2022-08-30 ENCOUNTER — Ambulatory Visit (INDEPENDENT_AMBULATORY_CARE_PROVIDER_SITE_OTHER): Payer: Medicaid Other | Admitting: Obstetrics and Gynecology

## 2022-08-30 VITALS — BP 115/79 | HR 85 | Wt 130.8 lb

## 2022-08-30 DIAGNOSIS — Z3A15 15 weeks gestation of pregnancy: Secondary | ICD-10-CM

## 2022-08-30 DIAGNOSIS — Z23 Encounter for immunization: Secondary | ICD-10-CM

## 2022-08-30 DIAGNOSIS — O09522 Supervision of elderly multigravida, second trimester: Secondary | ICD-10-CM

## 2022-08-30 DIAGNOSIS — O0992 Supervision of high risk pregnancy, unspecified, second trimester: Secondary | ICD-10-CM | POA: Diagnosis not present

## 2022-08-30 DIAGNOSIS — R87619 Unspecified abnormal cytological findings in specimens from cervix uteri: Secondary | ICD-10-CM | POA: Insufficient documentation

## 2022-08-30 DIAGNOSIS — O0990 Supervision of high risk pregnancy, unspecified, unspecified trimester: Secondary | ICD-10-CM

## 2022-08-30 DIAGNOSIS — Z1332 Encounter for screening for maternal depression: Secondary | ICD-10-CM | POA: Diagnosis not present

## 2022-08-30 DIAGNOSIS — O09521 Supervision of elderly multigravida, first trimester: Secondary | ICD-10-CM

## 2022-08-30 DIAGNOSIS — Z789 Other specified health status: Secondary | ICD-10-CM

## 2022-08-30 DIAGNOSIS — Z98891 History of uterine scar from previous surgery: Secondary | ICD-10-CM

## 2022-08-30 DIAGNOSIS — R87610 Atypical squamous cells of undetermined significance on cytologic smear of cervix (ASC-US): Secondary | ICD-10-CM

## 2022-08-30 MED ORDER — ASPIRIN 81 MG PO TBEC
81.0000 mg | DELAYED_RELEASE_TABLET | Freq: Every day | ORAL | 2 refills | Status: DC
  Filled 2022-08-30 – 2022-11-09 (×3): qty 300, 300d supply, fill #0

## 2022-08-30 NOTE — Progress Notes (Signed)
Subjective:  Michele Gomez is a 36 y.o. [phone] at 61w1dbeing seen today for her first OB visit. EDD by LMP. H/O C section x 3. She is currently monitored for the following issues for this high-risk pregnancy and has Supervision of high risk pregnancy, antepartum; History of cesarean section; AMA (advanced maternal age) multigravida 311+ Language barrier; and Abnormal Pap smear of cervix on their problem list.  Patient reports no complaints.  Contractions: Not present. Vag. Bleeding: None.  Movement: Present. Denies leaking of fluid.   The following portions of the patient's history were reviewed and updated as appropriate: allergies, current medications, past family history, past medical history, past social history, past surgical history and problem list. Problem list updated.  Objective:   Vitals:   08/30/22 0848  BP: 115/79  Pulse: 85  Weight: 130 lb 12.8 oz (59.3 kg)    Fetal Status: Fetal Heart Rate (bpm): 149   Movement: Present     General:  Alert, oriented and cooperative. Patient is in no acute distress.  Skin: Skin is warm and dry. No rash noted.   Cardiovascular: Normal heart rate noted  Respiratory: Normal respiratory effort, no problems with respiration noted  Abdomen: Soft, gravid, appropriate for gestational age. Pain/Pressure: Absent     Pelvic:  Cervical exam deferred        Extremities: Normal range of motion.  Edema: Trace  Mental Status: Normal mood and affect. Normal behavior. Normal judgment and thought content.   Urinalysis:      Assessment and Plan:  Pregnancy: G4P3003 at 142w1d1. Supervision of high risk pregnancy, antepartum Prenatal care reviewed with pt - AFP, Serum, Open Spina Bifida  2. History of cesarean section For repeat at 39 weeks  3. Multigravida of advanced maternal age in first trimester Negative Genetics today AFP today - Comp Met (CMET) - TSH - Protein / creatinine ratio, urine - aspirin EC 81 MG tablet; Take 1  tablet (81 mg total) by mouth daily. Take after 12 weeks for prevention of preeclampsia later in pregnancy  Dispense: 300 tablet; Refill: 2  4. Language barrier Live interrupter used during today's visit  5. Atypical squamous cells of undetermined significance on cytologic smear of cervix (ASC-US) Repeat pap smear post partum  Preterm labor symptoms and general obstetric precautions including but not limited to vaginal bleeding, contractions, leaking of fluid and fetal movement were reviewed in detail with the patient. Please refer to After Visit Summary for other counseling recommendations.  Return in about 4 weeks (around 09/27/2022) for OB visit, face to face, MD only.   ErChancy MilroyMD

## 2022-08-30 NOTE — Patient Instructions (Signed)
????? ?????? ?? ????? Second Trimester of Pregnancy  ???? ????? ?????? ?? ????? ?? ??????? ?????? ??? ??? ??????? ?????? ????????. ????? ??? ??? ?? ????? ?????? ??? ????? ??????. ??????? ?? ???? ????? ?????? ?? ????? ??? ?????? ??? ??? ???? ?????. ????? ????? ???? ?? ???? ?? ???? ?????? ??????? ?? ?????? ??????? ??????. ???? ????? ?????? ?? ??????:  ????? ?? ??? ????? ??????? ?? ????? ??????.  ???? ?????? ???? ???? ?????.  ????? ?????? ?????? ?????. ??? ????? ?????? ?? ?????? ???? ???? (?????) ?????. ??? ????? ????? ??????? ?? ??? ??? ?????? ??? 12 ???? ????? ??? 1 ??? ???????. ?? ?????? ?? ????? ?? ?????? ????? ?????? (?????) ??? ???????? 16 ?20 ?? ?????. ??????? ????? ?? ????? ????? ?????? ????? ?????? ?? ?????????? ?? ?????? ??? ???? ???? ????? ?????? ?? ?????. ???? ???????? ?????? ???? ???? ???? ???? ??? ?????? ??? ???????. ?????? ?????  ?????? ????? ?? ???????. ??? ??????? ???? ???? ????? ??????.  ??? ???? ???? ?????? ?????? ??? ??????? ?????? ????????.  ??? ????? ??? ?????? ?? ??????? ?????? ???? ????? ??? ?????.  ?? ???? ???? ?? ?????? ????? (????? ?? ???? ?????) ??? ?????.  ?? ???? ??? ???? ????? ?? ????? ????? ??? ?????? (??????? ?????????).  ?? ???? ???????? ?? ?????. ????? ?? ???? ??? ?????????? ????? ???? ????? ??????? ??????????? ?? ?????. ?? ????? ??? ?????? ????? ?? ????? ?????? ?? ????? ????? ?? ????? ?? ???? ???? ????? ????? ?? ??????. ?????? ????  ?? ?????? ???????.  ?? ?????? ?? ???? ?? ?? ??????.  ?? ?????? ?????????.  ?? ?????? ?? ???????? ?? ???? ???????? ????????? (??????? ????????).  ?? ???? ?????? ??? ???? ??????? ?????? ??????? ???????? ??????.  ?? ??????? ?????? ????? ???? ?????? ??? ???????.  ?? ?????? ???? ??? ?? ??????. ???? ??? ????: ? ????? ?? ?????. ? ??????? ????? ???? ???? ??????? ??????? ?? ?????. ? ???? ?? ????? ???????? ???? ???? ??????. ????? ????????? ??????? ?? ???????: ???????  ????? ??????? ????? ??????? ?????? ????? ????? ???????. ??? ?????  ?? ???? ???? ?? ?? ??? ????? ??????? ?? ????? ?????. ??? ?? ??????? ????? ????? ??? ??? ?????? ???? ??????? ??????.  ?????? ????????? ?? ??? ??????? ???? ????? ??? 600 ????????? (mcg) ?? ??? ???????. ????? ??????  ????? ?????? ??????? ????? ????? ??? ????? ????? ???????? ????? ????? ?????? ???? ???????? ??? ??????? ?????? ??????? ??????? ??????? ????? ?????.  ????? ?????? ?????? ???????? ??? ???????? ?? ??????? ??????? ??????. ???? ????? ????? ??? ?????? ???? ?? ???? ???? ?????.  ????? ??????? ??? ????? ??? ????????? ??????? ?? ???????? ?? ????? ?? ???? ?????: ? ????? ????? ????? ?? ??????? ???? ??? ????? ???? ??????.  ? ?????? ??????? ?????? ???????? ??? ????????? ??????? ??????? ???????? ?????????? ???????. ? ???? ????? ??????? ???? ????? ??? ??? ????? ?? ?????? ????????? ??????? ??? ??????? ??????? ?? ????????. ??????  ????? ??????? ??? ??????? ???? ??????? ??????. ???? ????? ?????? ??????? ????????? ?? ????? ???? ???????? ??????? ??? ?? ????? ?????. ????? ?????? ??????? 30 ????? ??? ????? ?????? ?? 5 ???? ?? ???????. ????? ?? ?????? ???????? ???????? ??? ????? ????????? ?? ?????.  ????? ?? ?????? ???????? ??? ???? ???? ?? ?????? ?? ????? ????? ?? ???? ?????.  ????? ?????? ???????? ???????? ??? ??? ???? ????? ???? ?? ????? ???? ?? ??? ???? ?? ????? ????? ?? ??? ?????.  ????? ??? ??????? ???????.  ??? ?????? ?????? ?????? ??????? ???????? ?? ?? ???? ???? ???? ??????? ?????? ??????? ??? ?????? ????. ????? ?????? ?????? ???? ????????  ????? ????? ??? ???? ??? ????? ????? ????? ?????? ???? ?? ?????.  ??? ?????? ????? ????? ?????? ????? ?? ??????? ?????? ?? ????????. ??????? ???? ??????? ??? ???? ???? ??????? ?????? ??? ???.  ??????? ?? ??? (?????) ????? ??? ???? ?????? ?? ?????? ????? ?? ???? ?? ???? ?????.  ??? ?????? ???????? ????????: ? ????? ??????? ??????? ??????? ??? ??????? ???? ??????? ??????. ? ????? ????? ????  15 ????? ?? ??? 3-4 ???? ??????. ? ???? ????? ?? ??????  ???????. ???????  ????? ???? ?????? ???? ??? ??????? ?????? ?? ????? ????.  ??????? ??????? ??????? ?????? ??????? ??? ??? ???? ??????? ??????? ????? ?? ?????? ????. ??? ??????  ?? ??????? ????? ?????? ??????? ?? ??? ?????? ?? ???????.  ?? ??????? ???? ???????. ?? ??????? ???????? ??????? ???????? ??? ????? ?????? ???????.  ????? ???????? ?? ?????? ????? ????? ??????? ????????? ?? ??? ?????. ???? ??????? ???? ?????? ???? ?? ???? ?????? ????? ?????? ?? ?????? ?????? ?? ???? ??????? ?? ????? ???? ???.  ?? ??????? ???????? ??????? ?? ?????? ?? ???????? ??? ????????? ?? ??????? ???? ?? ????? ????? ???? ??????? ?????? ??????? ???. ?????? ?? ?????????? ???????? ?? ??? ???????? ?? ??? ?????.  ?? ??????? ?? ?????? ?????? ????? ??? ????????? ?? ????? ??? ??????? ???????? ??????????? ???? ?????. ???????? ???? ??????? ?????? ??? ????? ??? ???????? ??????? ?? ???????. ??????? ????  ???? ???????? ??????? ??????? (?????? ?????? ?????)? ????? ????? ??????? ?????? ??? ????? ?????? ????? ????. ??????? ???? ??????? ?????? ???? ????? ???? ??????. ?????? ????? ?????? ????????. ???? ??? ???.  ????? ?? ???? ??????? ?????? ????? ??? ?????? ???? ?????? ?? ??? ???????.  ????? ???????? ??? ????? ??? ??????? ?? ??? ???? ???????? ?????? ?? ????? ???? ?????. ???? ?? ???? ??? ???? ??????? ?????? ??????? ???????? ?? ????? ??? ???????? ???????? ?? ????? ?????????. ????? ?????? ??? ?????? ?? ?????????:  ??????? ????????? ????? (American Pregnancy Association):? [email or web address]  ?????? ????????? ??? ?????? ???????? [employer]necologists):?? [email or web address]  ???? ??? ?????? (Office on Women's Health):? [email or web address] ????? ???????? ??????? ?????? ??? ????? ????? ??????? ???????:  ???? ???? ?? ???? ??? ?? ????? ??????.  ?????? ?? ?????? ??? ??????? ?? ???? ??? ?? ???? ?????.  ?????? ????? ?? ????? ????? ?? ??? ?? ?????? ?? ??? ???? ?? ????? ?????.  ????? ??  ??? ?? ????? ?? ??????? ???.  ??????? ?????? ????? ???????? ?? ??? ???? ???????.  ???? ??? ??? ??????.  ???? ????? ?? ???? ?? ????? ?? ?????? ?? ???????? ?? ??????? ?? ???????.  ??????? ???????. ???? ???????? ????? ?? ???????? ???????:  ???? ???? ?? ??????.  ???????? ?? ????? ?? ???? ?????.  ???????? ?? ???? ?? ?????? ???? ?? ?????.  ????? ?? ??????.  ??????? ???? ?? ?????.  ?????? ?????? ?????.  ??? ?????? ????? ????? ???????? ??? ?????? ???? ?????? ??? ???? ??????? ?????? ??????? ???.  ?????? ???? ?? ???? ?? ?????? ?? ?????? ?? ?????? ???? ???? ??? ?????? ????? ????? ?? ?????? ?? ??? ?????? ????. ????  ???? ????? ?????? ?? ????? ?? ??????? ?????? ??? ??? ??????? ?????? ???????? (?? ????? ?????? ??? ????? ??????).  ?? ??????? ???????? ??????? ?? ?????? ?? ???????? ??? ????????? ?? ??????? ???? ?? ????? ????? ???? ??????? ?????? ??????? ???. ?????? ?? ?????????? ???????? ?? ??? ???????? ?? ??? ?????.  ????? ??????? ??? ??????? ???? ??????? ??????. ???? ????? ?????? ??????? ????????? ?? ????? ???? ???????? ??????? ??? ?? ????? ?????.  ?????? ????? ?????? ????????. ???? ??? ???. ??? ????? ?? ??? ????????? ?? ???? ?????? ????????? ???? ?????? ???? ??????? ??????. ???? ?? ?????? ??? ????? ???? ?? ???? ?? ???? ??????? ??????.? Document Revised: 03/10/2020 Document Reviewed: 03/10/2020 Elsevier Patient Education  2023 [employer].

## 2022-08-30 NOTE — Addendum Note (Signed)
Addended by: Kathee Delton on: 08/30/2022 09:28 AM   Modules accepted: Orders

## 2022-08-31 ENCOUNTER — Encounter: Payer: Self-pay | Admitting: *Deleted

## 2022-08-31 LAB — COMPREHENSIVE METABOLIC PANEL
ALT: 11 IU/L (ref 0–32)
AST: 18 IU/L (ref 0–40)
Albumin/Globulin Ratio: 1.5 (ref 1.2–2.2)
Albumin: 3.9 g/dL (ref 3.9–4.9)
Alkaline Phosphatase: 42 IU/L — ABNORMAL LOW (ref 44–121)
BUN/Creatinine Ratio: 11 (ref 9–23)
BUN: 5 mg/dL — ABNORMAL LOW (ref 6–20)
Bilirubin Total: 0.3 mg/dL (ref 0.0–1.2)
CO2: 20 mmol/L (ref 20–29)
Calcium: 9.2 mg/dL (ref 8.7–10.2)
Chloride: 104 mmol/L (ref 96–106)
Creatinine, Ser: 0.44 mg/dL — ABNORMAL LOW (ref 0.57–1.00)
Globulin, Total: 2.6 g/dL (ref 1.5–4.5)
Glucose: 84 mg/dL (ref 70–99)
Potassium: 4 mmol/L (ref 3.5–5.2)
Sodium: 138 mmol/L (ref 134–144)
Total Protein: 6.5 g/dL (ref 6.0–8.5)
eGFR: 128 mL/min/{1.73_m2} (ref >59)

## 2022-08-31 LAB — TSH: TSH: 0.531 u[IU]/mL (ref 0.450–4.500)

## 2022-08-31 NOTE — Congregational Nurse Program (Signed)
  Dept: [phone]   Congregational Nurse Program Note  Date of Encounter: 08/31/2022  Past Medical History: Past Medical History:  Diagnosis Date   Medical history non-contributory     Encounter Details:  CNP Questionnaire - 08/31/22 1138       Questionnaire   Ask client: Do you give verbal consent for me to treat you today? Yes    Student Assistance N/A    [facility] S[facility]  [facility]    Visit Setting with Client Organization    Patient Status Refugee    Insurance Medicaid    Insurance/Financial Assistance Referral N/A    Medication Have Medication Insecurities;Provided Medication Assistance    Medical Provider Yes    Screening Referrals Made N/A    Medical Referrals Made N/A    Medical Appointment Made N/A    Recently w/o PCP, now 1st time PCP visit completed due to CNs referral or appointment made N/A    Food N/A    Transportation N/A    Housing/Utilities N/A    Interpersonal Safety N/A    Interventions Advocate/Support;Navigate Healthcare System;Case Management;Counsel;Educate    Abnormal to Normal Screening Since Last CN Visit Blood Glucose    Screenings CN Performed Blood Pressure;Blood Glucose    Sent Client to Lab for: N/A    Did client attend any of the following based off CNs referral or appointments made? N/A    ED Visit Averted Yes    Life-Saving Intervention Made N/A            Routine blood sugar and blood pressure done.  Nicole Cella Letzy Gullickson RN BSN PCCN  [facility] [phone]-office

## 2022-09-01 LAB — AFP, SERUM, OPEN SPINA BIFIDA
AFP MoM: 0.72
AFP Value: 23.7 ng/mL
Gest. Age on Collection Date: 15.1 weeks
Maternal Age At EDD: 37.5 yr
OSBR Risk 1 IN: 10000
Test Results:: NEGATIVE
Weight: 130 [lb_av]

## 2022-09-01 LAB — PROTEIN / CREATININE RATIO, URINE
Creatinine, Urine: 32.6 mg/dL
Protein, Ur: 16.2 mg/dL
Protein/Creat Ratio: 497 mg/g creat — ABNORMAL HIGH (ref 0–200)

## 2022-09-05 ENCOUNTER — Telehealth: Payer: Self-pay | Admitting: *Deleted

## 2022-09-05 NOTE — Telephone Encounter (Addendum)
-----   Message from [name], MD sent at 09/01/2022  2:42 PM EST ----- Please let pt know that her AFP genetic test was negative.  Thanks Casimiro Needle   12/11  Called pt w/[employer] interpreter [phone] and informed her of negative AFP genetic test. She voiced understanding and had no questions.

## 2022-09-06 DIAGNOSIS — Z0130 Encounter for examination of blood pressure without abnormal findings: Secondary | ICD-10-CM

## 2022-09-06 NOTE — Congregational Nurse Program (Signed)
  Dept: [phone]   Congregational Nurse Program Note  Date of Encounter: 09/06/2022  Past Medical History: Past Medical History:  Diagnosis Date   Medical history non-contributory     Encounter Details:  CNP Questionnaire - 09/06/22 1241       Questionnaire   Ask client: Do you give verbal consent for me to treat you today? Yes    Student Assistance N/A    [facility] Served  [facility]    Visit Setting with Client Organization    Patient Status Refugee    Insurance Medicaid    Insurance/Financial Assistance Referral N/A    Medication Have Medication Insecurities;Provided Medication Assistance    Medical Provider Yes    Screening Referrals Made N/A    Medical Referrals Made N/A    Medical Appointment Made N/A    Recently w/o PCP, now 1st time PCP visit completed due to CNs referral or appointment made N/A    Food N/A    Transportation N/A    Housing/Utilities N/A    Interpersonal Safety N/A    Interventions Advocate/Support;Navigate Healthcare System;Case Management;Counsel;Educate    Abnormal to Normal Screening Since Last CN Visit Blood Glucose    Screenings CN Performed Blood Pressure;Blood Glucose    Sent Client to Lab for: N/A    Did client attend any of the following based off CNs referral or appointments made? Yes;Medication Assistance    ED Visit Averted Yes    Life-Saving Intervention Made N/A            Patient came in for weekly follow up with Congregational RN. She is out of prenatal vitamins and ASA. I have called pharmacy for refills.  Nicole Cella Jaydien Panepinto RN BSN PCCN  [facility] [phone]-cell [phone]-office

## 2022-09-07 ENCOUNTER — Other Ambulatory Visit (HOSPITAL_COMMUNITY): Payer: Self-pay

## 2022-09-28 ENCOUNTER — Ambulatory Visit: Payer: Medicaid Other | Attending: Obstetrics and Gynecology

## 2022-09-28 ENCOUNTER — Other Ambulatory Visit: Payer: Self-pay | Admitting: *Deleted

## 2022-09-28 ENCOUNTER — Ambulatory Visit: Payer: Medicaid Other | Admitting: *Deleted

## 2022-09-28 VITALS — BP 140/77 | HR 88

## 2022-09-28 DIAGNOSIS — O09522 Supervision of elderly multigravida, second trimester: Secondary | ICD-10-CM

## 2022-09-28 DIAGNOSIS — Z363 Encounter for antenatal screening for malformations: Secondary | ICD-10-CM

## 2022-09-28 DIAGNOSIS — Z3A19 19 weeks gestation of pregnancy: Secondary | ICD-10-CM

## 2022-09-28 DIAGNOSIS — Z98891 History of uterine scar from previous surgery: Secondary | ICD-10-CM | POA: Diagnosis present

## 2022-09-28 DIAGNOSIS — O10912 Unspecified pre-existing hypertension complicating pregnancy, second trimester: Secondary | ICD-10-CM

## 2022-09-28 DIAGNOSIS — O0990 Supervision of high risk pregnancy, unspecified, unspecified trimester: Secondary | ICD-10-CM

## 2022-09-28 DIAGNOSIS — O34219 Maternal care for unspecified type scar from previous cesarean delivery: Secondary | ICD-10-CM

## 2022-09-29 ENCOUNTER — Encounter: Payer: No Typology Code available for payment source | Admitting: Obstetrics & Gynecology

## 2022-09-30 ENCOUNTER — Encounter: Payer: Self-pay | Admitting: Advanced Practice Midwife

## 2022-09-30 ENCOUNTER — Ambulatory Visit (INDEPENDENT_AMBULATORY_CARE_PROVIDER_SITE_OTHER): Payer: Medicaid Other | Admitting: Advanced Practice Midwife

## 2022-09-30 VITALS — BP 126/81 | HR 94 | Wt 137.0 lb

## 2022-09-30 DIAGNOSIS — Z98891 History of uterine scar from previous surgery: Secondary | ICD-10-CM

## 2022-09-30 DIAGNOSIS — O0990 Supervision of high risk pregnancy, unspecified, unspecified trimester: Secondary | ICD-10-CM

## 2022-09-30 DIAGNOSIS — Z3A19 19 weeks gestation of pregnancy: Secondary | ICD-10-CM

## 2022-09-30 DIAGNOSIS — O09522 Supervision of elderly multigravida, second trimester: Secondary | ICD-10-CM

## 2022-09-30 NOTE — Progress Notes (Signed)
   PRENATAL VISIT NOTE  Subjective:  Michele Gomez is a 36 y.o. [phone] at [redacted]w[redacted]d being seen today for ongoing prenatal care.  She is currently monitored for the following issues for this high-risk pregnancy and has Supervision of high risk pregnancy, antepartum; History of cesarean section; AMA (advanced maternal age) multigravida 41+; Language barrier; and Abnormal Pap smear of cervix on their problem list.  Patient reports no complaints.  Contractions: Not present. Vag. Bleeding: None.  Movement: Present. Denies leaking of fluid.   The following portions of the patient's history were reviewed and updated as appropriate: allergies, current medications, past family history, past medical history, past social history, past surgical history and problem list.   Objective:   Vitals:   09/30/22 0835  BP: 126/81  Pulse: 94  Weight: 137 lb (62.1 kg)    Fetal Status: Fetal Heart Rate (bpm): 146   Movement: Present     General:  Alert, oriented and cooperative. Patient is in no acute distress.  Skin: Skin is warm and dry. No rash noted.   Cardiovascular: Normal heart rate noted  Respiratory: Normal respiratory effort, no problems with respiration noted  Abdomen: Soft, gravid, appropriate for gestational age.  Pain/Pressure: Absent     Pelvic: Cervical exam deferred        Extremities: Normal range of motion.  Edema: None  Mental Status: Normal mood and affect. Normal behavior. Normal judgment and thought content.   Assessment and Plan:  Pregnancy: G4P3003 at [redacted]w[redacted]d 1. Supervision of high risk pregnancy, antepartum - Patient needs to visit the food bank today. She also needs assistance with WIC. RN will provide phone number and information for patient to call.   2. History of cesarean section - X 3, plan repeat  3. Multigravida of advanced maternal age in second trimester - FU Growth with MFM on 12/14  4. [redacted] weeks gestation of pregnancy   Preterm labor symptoms and  general obstetric precautions including but not limited to vaginal bleeding, contractions, leaking of fluid and fetal movement were reviewed in detail with the patient. Please refer to After Visit Summary for other counseling recommendations.   Return in about 4 weeks (around 10/28/2022).  Future Appointments  Date Time Provider [place]  11/09/2022  8:30 AM [facility] NURSE [place] [facility]  11/09/2022  8:45 AM [facility] US4 [facility] [place] DNP, CNM  09/30/22  8:57 AM   In person Arabic interpretor used for encounter

## 2022-10-04 ENCOUNTER — Other Ambulatory Visit (HOSPITAL_COMMUNITY): Payer: Self-pay

## 2022-10-04 ENCOUNTER — Telehealth: Payer: Self-pay

## 2022-10-04 NOTE — Telephone Encounter (Signed)
Pre natal vitamins refilled per patient request.  Honor Loh RN BSN [place] [facility] [phone]

## 2022-10-31 ENCOUNTER — Ambulatory Visit (INDEPENDENT_AMBULATORY_CARE_PROVIDER_SITE_OTHER): Payer: Medicaid Other | Admitting: Obstetrics and Gynecology

## 2022-10-31 VITALS — BP 114/77 | HR 92 | Wt 142.1 lb

## 2022-10-31 DIAGNOSIS — Z789 Other specified health status: Secondary | ICD-10-CM

## 2022-10-31 DIAGNOSIS — Z98891 History of uterine scar from previous surgery: Secondary | ICD-10-CM

## 2022-10-31 DIAGNOSIS — O0990 Supervision of high risk pregnancy, unspecified, unspecified trimester: Secondary | ICD-10-CM

## 2022-10-31 DIAGNOSIS — R87610 Atypical squamous cells of undetermined significance on cytologic smear of cervix (ASC-US): Secondary | ICD-10-CM

## 2022-10-31 DIAGNOSIS — O0992 Supervision of high risk pregnancy, unspecified, second trimester: Secondary | ICD-10-CM

## 2022-10-31 DIAGNOSIS — O09522 Supervision of elderly multigravida, second trimester: Secondary | ICD-10-CM

## 2022-10-31 DIAGNOSIS — Z3A24 24 weeks gestation of pregnancy: Secondary | ICD-10-CM

## 2022-10-31 NOTE — Progress Notes (Signed)
    PRENATAL VISIT NOTE  Subjective:  Michele Gomez is a 37 y.o. [phone] at [redacted]w[redacted]d being seen today for ongoing prenatal care.  She is currently monitored for the following issues for this high-risk pregnancy and has Supervision of high risk pregnancy, antepartum; History of cesarean section; AMA (advanced maternal age) multigravida 34+; Language barrier; and Abnormal Pap smear of cervix on their problem list.  Patient reports no complaints.  Contractions: Irritability. Vag. Bleeding: None.  Movement: Present. Denies leaking of fluid.   The following portions of the patient's history were reviewed and updated as appropriate: allergies, current medications, past family history, past medical history, past social history, past surgical history and problem list.   Objective:   Vitals:   10/31/22 0954  BP: 114/77  Pulse: 92  Weight: 142 lb 1.6 oz (64.5 kg)    Fetal Status: Fetal Heart Rate (bpm): 148 Fundal Height: 23 cm Movement: Present     General:  Alert, oriented and cooperative. Patient is in no acute distress.  Skin: Skin is warm and dry. No rash noted.   Cardiovascular: Normal heart rate noted  Respiratory: Normal respiratory effort, no problems with respiration noted  Abdomen: Soft, gravid, appropriate for gestational age.  Pain/Pressure: Present     Pelvic: Cervical exam deferred        Extremities: Normal range of motion.  Edema: None  Mental Status: Normal mood and affect. Normal behavior. Normal judgment and thought content.   Assessment and Plan:  Pregnancy: G4P3003 at [redacted]w[redacted]d 1. Supervision of high risk pregnancy, antepartum 28wk labs next visit Pregnancy navigator to contact pt to help with WIC, etc Inpatient nexplanon  2. History of cesarean section X 3. Last one in [place]. She states they didn't mention any issues or problems at the time of surgery Can scheduled rpt at 28wk-30wk visit.   64. Multigravida of advanced maternal age in second  trimester Has surveillance growth on 2/13 1/3: 88%, 331g, ac 64%, afi wnl   4. Atypical squamous cells of undetermined significance on cytologic smear of cervix (ASC-US) Repeat pap PP  5. Language barrier In person interpreter used  Preterm labor symptoms and general obstetric precautions including but not limited to vaginal bleeding, contractions, leaking of fluid and fetal movement were reviewed in detail with the patient. Please refer to After Visit Summary for other counseling recommendations.   Return in about 3 weeks (around 11/21/2022) for in person, md or app, fasting 2hr GTT.  Future Appointments  Date Time Provider [place]  11/08/2022  8:30 AM [facility] NURSE [facility]  11/08/2022  8:45 AM [facility] US6 [facility]    Aletha Halim, MD

## 2022-11-02 NOTE — Congregational Nurse Program (Signed)
  Dept: [phone]   Congregational Nurse Program Note  Date of Encounter: 11/02/2022  Past Medical History: Past Medical History:  Diagnosis Date   Medical history non-contributory     Encounter Details:  CNP Questionnaire - 11/02/22 1131       Questionnaire   Ask client: Do you give verbal consent for me to treat you today? Yes    Student Assistance N/A    [facility] Served  [facility]    Visit Setting with Client Organization    Patient Status Refugee    Insurance Medicaid    Insurance/Financial Assistance Referral N/A    Medication Have Medication Insecurities;Provided Medication Assistance    Medical Provider Yes    Screening Referrals Made N/A    Medical Referrals Made N/A    Medical Appointment Made N/A    Recently w/o PCP, now 1st time PCP visit completed due to CNs referral or appointment made N/A    Food N/A    Transportation N/A    Housing/Utilities N/A    Interpersonal Safety N/A    Interventions Advocate/Support;Navigate Healthcare System;Case Management;Counsel;Educate    Abnormal to Normal Screening Since Last CN Visit Blood Glucose    Screenings CN Performed Blood Pressure;Blood Glucose    Sent Client to Lab for: N/A    Did client attend any of the following based off CNs referral or appointments made? Yes;Medication Assistance    ED Visit Averted Yes    Life-Saving Intervention Made N/A            Patient would like assistance with medication refill. [facility] refill.  Earlie Server Chaske Paskett RN BSN [place] Nurse [phone]

## 2022-11-08 ENCOUNTER — Ambulatory Visit (HOSPITAL_BASED_OUTPATIENT_CLINIC_OR_DEPARTMENT_OTHER): Payer: Medicaid Other

## 2022-11-08 ENCOUNTER — Ambulatory Visit: Payer: Medicaid Other | Attending: Maternal & Fetal Medicine

## 2022-11-08 ENCOUNTER — Other Ambulatory Visit: Payer: Self-pay

## 2022-11-08 VITALS — BP 115/71 | HR 89

## 2022-11-08 DIAGNOSIS — O09292 Supervision of pregnancy with other poor reproductive or obstetric history, second trimester: Secondary | ICD-10-CM

## 2022-11-08 DIAGNOSIS — O10912 Unspecified pre-existing hypertension complicating pregnancy, second trimester: Secondary | ICD-10-CM | POA: Diagnosis present

## 2022-11-08 DIAGNOSIS — O3662X0 Maternal care for excessive fetal growth, second trimester, not applicable or unspecified: Secondary | ICD-10-CM | POA: Insufficient documentation

## 2022-11-08 DIAGNOSIS — O34219 Maternal care for unspecified type scar from previous cesarean delivery: Secondary | ICD-10-CM

## 2022-11-08 DIAGNOSIS — O09522 Supervision of elderly multigravida, second trimester: Secondary | ICD-10-CM

## 2022-11-08 DIAGNOSIS — Z3A25 25 weeks gestation of pregnancy: Secondary | ICD-10-CM | POA: Diagnosis not present

## 2022-11-08 DIAGNOSIS — Z362 Encounter for other antenatal screening follow-up: Secondary | ICD-10-CM | POA: Diagnosis present

## 2022-11-08 DIAGNOSIS — O0990 Supervision of high risk pregnancy, unspecified, unspecified trimester: Secondary | ICD-10-CM

## 2022-11-08 DIAGNOSIS — Z3689 Encounter for other specified antenatal screening: Secondary | ICD-10-CM

## 2022-11-08 DIAGNOSIS — Z98891 History of uterine scar from previous surgery: Secondary | ICD-10-CM

## 2022-11-09 ENCOUNTER — Ambulatory Visit: Payer: Medicaid Other

## 2022-11-09 ENCOUNTER — Other Ambulatory Visit (HOSPITAL_COMMUNITY): Payer: Self-pay

## 2022-11-09 NOTE — Congregational Nurse Program (Signed)
  Dept: [phone]   Congregational Nurse Program Note  Date of Encounter: 11/09/2022  Past Medical History: Past Medical History:  Diagnosis Date   Medical history non-contributory     Encounter Details:  CNP Questionnaire - 11/09/22 1228       Questionnaire   Ask client: Do you give verbal consent for me to treat you today? Yes    Student Assistance N/A    [facility] Patient Served  [facility]    Visit Setting with Client Organization    Patient Status Refugee    Insurance Medicaid    Insurance/Financial Assistance Referral N/A    Medication Have Medication Insecurities;Provided Medication Assistance    Medical Provider Yes    Screening Referrals Made N/A    Medical Referrals Made N/A    Medical Appointment Made N/A    Recently w/o PCP, now 1st time PCP visit completed due to CNs referral or appointment made N/A    Food N/A    Transportation N/A    Housing/Utilities N/A    Interpersonal Safety N/A    Interventions Advocate/Support;Navigate Healthcare System;Case Management;Counsel;Educate    Abnormal to Normal Screening Since Last CN Visit Blood Glucose    Screenings CN Performed Blood Pressure;Blood Glucose    Sent Client to Lab for: N/A    Did client attend any of the following based off CNs referral or appointments made? Yes;Medication Assistance    ED Visit Averted Yes    Life-Saving Intervention Made N/A             Medication picked up from [place] and delivered to patient here at school. Education on medication provided.  Earlie Server Ilia Engelbert RN BSN [place] Nurse [phone]

## 2022-11-18 ENCOUNTER — Other Ambulatory Visit: Payer: Self-pay | Admitting: *Deleted

## 2022-11-18 DIAGNOSIS — O0990 Supervision of high risk pregnancy, unspecified, unspecified trimester: Secondary | ICD-10-CM

## 2022-11-21 ENCOUNTER — Encounter: Payer: Self-pay | Admitting: Obstetrics & Gynecology

## 2022-11-21 ENCOUNTER — Ambulatory Visit (INDEPENDENT_AMBULATORY_CARE_PROVIDER_SITE_OTHER): Payer: Medicaid Other | Admitting: Obstetrics & Gynecology

## 2022-11-21 ENCOUNTER — Other Ambulatory Visit: Payer: Medicaid Other

## 2022-11-21 VITALS — BP 121/80 | HR 90 | Wt 147.0 lb

## 2022-11-21 DIAGNOSIS — O0990 Supervision of high risk pregnancy, unspecified, unspecified trimester: Secondary | ICD-10-CM

## 2022-11-21 DIAGNOSIS — O0992 Supervision of high risk pregnancy, unspecified, second trimester: Secondary | ICD-10-CM

## 2022-11-21 DIAGNOSIS — O09522 Supervision of elderly multigravida, second trimester: Secondary | ICD-10-CM

## 2022-11-21 DIAGNOSIS — Z98891 History of uterine scar from previous surgery: Secondary | ICD-10-CM

## 2022-11-21 DIAGNOSIS — Z3A27 27 weeks gestation of pregnancy: Secondary | ICD-10-CM

## 2022-11-21 NOTE — Progress Notes (Signed)
   PRENATAL VISIT NOTE  Subjective:  Michele Gomez is a 37 y.o. [phone] at 66w0dbeing seen today for ongoing prenatal care.  She is currently monitored for the following issues for this high-risk pregnancy and has Supervision of high risk pregnancy, antepartum; History of cesarean section; AMA (advanced maternal age) multigravida 359+ Language barrier; and Abnormal Pap smear of cervix on their problem list.  Patient reports no complaints.  Contractions: Irritability. Vag. Bleeding: None.  Movement: Present. Denies leaking of fluid.   The following portions of the patient's history were reviewed and updated as appropriate: allergies, current medications, past family history, past medical history, past social history, past surgical history and problem list.   Objective:   Vitals:   11/21/22 0927  BP: 121/80  Pulse: 90  Weight: 66.7 kg    Fetal Status: Fetal Heart Rate (bpm): 142   Movement: Present     General:  Alert, oriented and cooperative. Patient is in no acute distress.  Skin: Skin is warm and dry. No rash noted.   Cardiovascular: Normal heart rate noted  Respiratory: Normal respiratory effort, no problems with respiration noted  Abdomen: Soft, gravid, appropriate for gestational age.  Pain/Pressure: Absent     Pelvic: Cervical exam deferred        Extremities: Normal range of motion.  Edema: None  Mental Status: Normal mood and affect. Normal behavior. Normal judgment and thought content.   Assessment and Plan:  Pregnancy: G4P3003 at 253w0d. Supervision of high risk pregnancy, antepartum Doing well  2. Multigravida of advanced maternal age in second trimester 2729w0d3. History of cesarean section Repeat at 39 weeks  Preterm labor symptoms and general obstetric precautions including but not limited to vaginal bleeding, contractions, leaking of fluid and fetal movement were reviewed in detail with the patient. Please refer to After Visit Summary for  other counseling recommendations.   Return in about 3 weeks (around 12/12/2022).  Future Appointments  Date Time Provider [place]ined [place]/10/2022  8:15 AM [facility]UR[facility]10/2022  8:30 AM [facility] JamEmeterio ReeveD

## 2022-11-22 LAB — RPR: RPR Ser Ql: NONREACTIVE

## 2022-11-22 LAB — GLUCOSE TOLERANCE, 2 HOURS W/ 1HR
Glucose, 1 hour: 149 mg/dL (ref 70–179)
Glucose, 2 hour: 75 mg/dL (ref 70–152)
Glucose, Fasting: 82 mg/dL (ref 70–91)

## 2022-11-22 LAB — CBC
Hematocrit: 35.5 % (ref 34.0–46.6)
Hemoglobin: 12.5 g/dL (ref 11.1–15.9)
MCH: 31.7 pg (ref 26.6–33.0)
MCHC: 35.2 g/dL (ref 31.5–35.7)
MCV: 90 fL (ref 79–97)
Platelets: 184 10*3/uL (ref 150–450)
RBC: 3.94 x10E6/uL (ref 3.77–5.28)
RDW: 12.2 % (ref 11.7–15.4)
WBC: 6.5 10*3/uL (ref 3.4–10.8)

## 2022-11-22 LAB — HIV ANTIBODY (ROUTINE TESTING W REFLEX): HIV Screen 4th Generation wRfx: NONREACTIVE

## 2022-12-12 ENCOUNTER — Encounter: Payer: Medicaid Other | Admitting: Obstetrics and Gynecology

## 2022-12-13 ENCOUNTER — Encounter: Payer: Self-pay | Admitting: Family Medicine

## 2022-12-13 ENCOUNTER — Other Ambulatory Visit: Payer: Self-pay

## 2022-12-13 ENCOUNTER — Ambulatory Visit (INDEPENDENT_AMBULATORY_CARE_PROVIDER_SITE_OTHER): Payer: Medicaid Other | Admitting: Obstetrics and Gynecology

## 2022-12-13 VITALS — BP 126/82 | HR 94 | Wt 153.0 lb

## 2022-12-13 DIAGNOSIS — Z98891 History of uterine scar from previous surgery: Secondary | ICD-10-CM

## 2022-12-13 DIAGNOSIS — O0993 Supervision of high risk pregnancy, unspecified, third trimester: Secondary | ICD-10-CM

## 2022-12-13 DIAGNOSIS — O09523 Supervision of elderly multigravida, third trimester: Secondary | ICD-10-CM

## 2022-12-13 DIAGNOSIS — O09522 Supervision of elderly multigravida, second trimester: Secondary | ICD-10-CM

## 2022-12-13 DIAGNOSIS — O0990 Supervision of high risk pregnancy, unspecified, unspecified trimester: Secondary | ICD-10-CM

## 2022-12-13 DIAGNOSIS — Z3A30 30 weeks gestation of pregnancy: Secondary | ICD-10-CM

## 2022-12-13 DIAGNOSIS — Z789 Other specified health status: Secondary | ICD-10-CM

## 2022-12-13 NOTE — Progress Notes (Addendum)
   PRENATAL VISIT NOTE  Subjective:  Michele Gomez is a 37 y.o. [phone] at [redacted]w[redacted]d being seen today for ongoing prenatal care.  She is currently monitored for the following issues for this high-risk pregnancy and has Supervision of high risk pregnancy, antepartum; History of cesarean section; AMA (advanced maternal age) multigravida 17+; Language barrier; and Abnormal Pap smear of cervix on their problem list.  Patient reports no complaints.  Contractions: Irritability. Vag. Bleeding: None.  Movement: Present. Denies leaking of fluid.   The following portions of the patient's history were reviewed and updated as appropriate: allergies, current medications, past family history, past medical history, past social history, past surgical history and problem list.   Objective:   Vitals:   12/13/22 1034  BP: 126/82  Pulse: 94  Weight: 153 lb (69.4 kg)    Fetal Status: Fetal Heart Rate (bpm): 153   Movement: Present     General:  Alert, oriented and cooperative. Patient is in no acute distress.  Skin: Skin is warm and dry. No rash noted.   Cardiovascular: Normal heart rate noted  Respiratory: Normal respiratory effort, no problems with respiration noted  Abdomen: Soft, gravid, appropriate for gestational age.  Pain/Pressure: Present     Pelvic: Cervical exam deferred        Extremities: Normal range of motion.     Mental Status: Normal mood and affect. Normal behavior. Normal judgment and thought content.   Assessment and Plan:  Pregnancy: G4P3003 at [redacted]w[redacted]d 1. Supervision of high risk pregnancy, antepartum BP and FHR normal No concerns at this time   2. Multigravida of advanced maternal age in second trimester  3. History of cesarean section Previous x3, scheduled for 02/13/2023 EFW 97% on 11/08/22 U/S scheduled for 12/27/22  4. Language barrier Interpreter at bedside   Preterm labor symptoms and general obstetric precautions including but not limited to vaginal  bleeding, contractions, leaking of fluid and fetal movement were reviewed in detail with the patient. Please refer to After Visit Summary for other counseling recommendations.   Future Appointments  Date Time Provider [place]  12/27/2022  8:15 AM [facility] NURSE [facility]  12/27/2022  8:30 AM [facility] US2 [facility] [place]    Return in about 2 weeks (around 12/27/2022) for OB VISIT (MD or APP).  Future Appointments  Date Time Provider [place]  12/27/2022  8:15 AM [facility] NURSE [facility]  12/27/2022  8:30 AM [facility] US2 [facility] [place], FNP

## 2022-12-27 ENCOUNTER — Ambulatory Visit: Payer: Medicaid Other | Admitting: *Deleted

## 2022-12-27 ENCOUNTER — Ambulatory Visit: Payer: Medicaid Other | Attending: Obstetrics

## 2022-12-27 VITALS — BP 113/77 | HR 93

## 2022-12-27 DIAGNOSIS — O09522 Supervision of elderly multigravida, second trimester: Secondary | ICD-10-CM | POA: Diagnosis not present

## 2022-12-27 DIAGNOSIS — O09293 Supervision of pregnancy with other poor reproductive or obstetric history, third trimester: Secondary | ICD-10-CM

## 2022-12-27 DIAGNOSIS — O09292 Supervision of pregnancy with other poor reproductive or obstetric history, second trimester: Secondary | ICD-10-CM | POA: Diagnosis present

## 2022-12-27 DIAGNOSIS — O0990 Supervision of high risk pregnancy, unspecified, unspecified trimester: Secondary | ICD-10-CM | POA: Insufficient documentation

## 2022-12-27 DIAGNOSIS — Z98891 History of uterine scar from previous surgery: Secondary | ICD-10-CM | POA: Insufficient documentation

## 2022-12-27 DIAGNOSIS — O34219 Maternal care for unspecified type scar from previous cesarean delivery: Secondary | ICD-10-CM | POA: Insufficient documentation

## 2022-12-27 DIAGNOSIS — Z3689 Encounter for other specified antenatal screening: Secondary | ICD-10-CM | POA: Insufficient documentation

## 2022-12-27 DIAGNOSIS — O09523 Supervision of elderly multigravida, third trimester: Secondary | ICD-10-CM | POA: Diagnosis not present

## 2022-12-27 DIAGNOSIS — Z3A32 32 weeks gestation of pregnancy: Secondary | ICD-10-CM | POA: Diagnosis not present

## 2023-01-02 ENCOUNTER — Other Ambulatory Visit: Payer: Self-pay

## 2023-01-02 ENCOUNTER — Ambulatory Visit (INDEPENDENT_AMBULATORY_CARE_PROVIDER_SITE_OTHER): Payer: Medicaid Other | Admitting: Obstetrics and Gynecology

## 2023-01-02 ENCOUNTER — Encounter: Payer: Self-pay | Admitting: Obstetrics and Gynecology

## 2023-01-02 VITALS — BP 126/87 | HR 85 | Wt 153.3 lb

## 2023-01-02 DIAGNOSIS — Z603 Acculturation difficulty: Secondary | ICD-10-CM

## 2023-01-02 DIAGNOSIS — Z3A33 33 weeks gestation of pregnancy: Secondary | ICD-10-CM

## 2023-01-02 DIAGNOSIS — O0990 Supervision of high risk pregnancy, unspecified, unspecified trimester: Secondary | ICD-10-CM

## 2023-01-02 DIAGNOSIS — O09523 Supervision of elderly multigravida, third trimester: Secondary | ICD-10-CM

## 2023-01-02 DIAGNOSIS — O0993 Supervision of high risk pregnancy, unspecified, third trimester: Secondary | ICD-10-CM

## 2023-01-02 DIAGNOSIS — Z98891 History of uterine scar from previous surgery: Secondary | ICD-10-CM

## 2023-01-02 DIAGNOSIS — Z758 Other problems related to medical facilities and other health care: Secondary | ICD-10-CM

## 2023-01-02 DIAGNOSIS — R87610 Atypical squamous cells of undetermined significance on cytologic smear of cervix (ASC-US): Secondary | ICD-10-CM

## 2023-01-02 MED ORDER — OMEPRAZOLE 20 MG PO CPDR: 20.0000 mg | DELAYED_RELEASE_CAPSULE | Freq: Two times a day (BID) | ORAL | 3 refills | Status: DC

## 2023-01-02 NOTE — Progress Notes (Signed)
Subjective:  Michele Gomez is a 37 y.o. [phone] at [redacted]w[redacted]d being seen today for ongoing prenatal care.  She is currently monitored for the following issues for this low-risk pregnancy and has Supervision of high risk pregnancy, antepartum; History of cesarean section; AMA (advanced maternal age) multigravida 35+; Language barrier; and Abnormal Pap smear of cervix on their problem list.  Patient reports heartburn.  Contractions: Irritability. Vag. Bleeding: None.  Movement: Present. Denies leaking of fluid.   The following portions of the patient's history were reviewed and updated as appropriate: allergies, current medications, past family history, past medical history, past social history, past surgical history and problem list. Problem list updated.  Objective:   Vitals:   01/02/23 1149  BP: 126/87  Pulse: 85  Weight: 153 lb 4.8 oz (69.5 kg)    Fetal Status: Fetal Heart Rate (bpm): 141   Movement: Present     General:  Alert, oriented and cooperative. Patient is in no acute distress.  Skin: Skin is warm and dry. No rash noted.   Cardiovascular: Normal heart rate noted  Respiratory: Normal respiratory effort, no problems with respiration noted  Abdomen: Soft, gravid, appropriate for gestational age. Pain/Pressure: Present     Pelvic:  Cervical exam deferred        Extremities: Normal range of motion.  Edema: Trace  Mental Status: Normal mood and affect. Normal behavior. Normal judgment and thought content.   Urinalysis:      Assessment and Plan:  Pregnancy: G4P3003 at [redacted]w[redacted]d  1. Supervision of high risk pregnancy, antepartum Stable - omeprazole (PRILOSEC) 20 MG capsule; Take 1 capsule (20 mg total) by mouth 2 (two) times daily before a meal.  Dispense: 60 capsule; Refill: 3  2. History of cesarean section For repeat  3. Multigravida of advanced maternal age in third trimester Stable  4. Language barrier Live interpreter used during today's visit  5. Atypical  squamous cells of undetermined significance on cytologic smear of cervix (ASC-US) Repeat pap smear PP  Preterm labor symptoms and general obstetric precautions including but not limited to vaginal bleeding, contractions, leaking of fluid and fetal movement were reviewed in detail with the patient. Please refer to After Visit Summary for other counseling recommendations.  Return in about 2 weeks (around 01/16/2023) for OB visit, face to face, any provider.   [name], MD

## 2023-01-03 NOTE — Congregational Nurse Program (Signed)
   Dept: [phone]   Congregational Nurse Program Note  Date of Encounter: 01/03/2023  Past Medical History: Past Medical History:  Diagnosis Date   Medical history non-contributory     Encounter Details:  CNP Questionnaire - 01/03/23 1215       Questionnaire   Ask client: Do you give verbal consent for me to treat you today? Yes    Student Assistance N/A    Location Patient Served  [facility]    Visit Setting with Client Organization    Patient Status Refugee    Insurance Medicaid    Insurance/Financial Assistance Referral N/A    Medication Have Medication Insecurities;Provided Medication Assistance    Medical Provider Yes    Screening Referrals Made N/A    Medical Referrals Made N/A    Medical Appointment Made N/A    Recently w/o PCP, now 1st time PCP visit completed due to CNs referral or appointment made N/A    Food N/A    Transportation N/A    Housing/Utilities N/A    Interpersonal Safety N/A    Interventions Advocate/Support;Navigate Healthcare System;Case Management;Counsel;Educate    Abnormal to Normal Screening Since Last CN Visit N/A    Screenings CN Performed N/A    Sent Client to Lab for: N/A    Did client attend any of the following based off CNs referral or appointments made? N/A    ED Visit Averted Yes    Life-Saving Intervention Made N/A           Patient came in with questions regarding new medication Prilosec. All questions answered. Advised to go to pharmacy and pick up medications. Advised to call me if she experiences difficulties.  Nicole Cella Mazy Culton RN BSN PCCN  [facility] [phone]-office

## 2023-01-11 NOTE — Congregational Nurse Program (Signed)
  Dept: [phone]   Congregational Nurse Program Note  Date of Encounter: 01/11/2023  Past Medical History: Past Medical History:  Diagnosis Date   Medical history non-contributory     Encounter Details:  CNP Questionnaire - 01/11/23 1159       Questionnaire   Ask client: Do you give verbal consent for me to treat you today? Yes    Student Assistance N/A    Location Patient Served  [facility]    Visit Setting with Client Organization    Patient Status Refugee    Insurance Medicaid    Insurance/Financial Assistance Referral N/A    Medication Have Medication Insecurities;Provided Medication Assistance    Medical Provider Yes    Screening Referrals Made N/A    Medical Referrals Made N/A    Medical Appointment Made N/A    Recently w/o PCP, now 1st time PCP visit completed due to CNs referral or appointment made N/A    Food N/A    Transportation N/A    Housing/Utilities N/A    Interpersonal Safety N/A    Interventions Advocate/Support;Navigate Healthcare System;Case Management;Counsel;Educate    Abnormal to Normal Screening Since Last CN Visit N/A    Screenings CN Performed N/A    Sent Client to Lab for: N/A    Did client attend any of the following based off CNs referral or appointments made? N/A    ED Visit Averted Yes    Life-Saving Intervention Made N/A            Patient brought in omeprazole with questions on how to take it. All questions answered regarding omeprazole.  Nicole Cella Malisa Ruggiero RN BSN PCCN  [facility] [phone]-office

## 2023-01-20 ENCOUNTER — Other Ambulatory Visit: Payer: Self-pay

## 2023-01-20 ENCOUNTER — Ambulatory Visit (INDEPENDENT_AMBULATORY_CARE_PROVIDER_SITE_OTHER): Payer: Medicaid Other | Admitting: Advanced Practice Midwife

## 2023-01-20 VITALS — BP 119/80 | HR 96 | Wt 162.8 lb

## 2023-01-20 DIAGNOSIS — Z603 Acculturation difficulty: Secondary | ICD-10-CM

## 2023-01-20 DIAGNOSIS — Z3A35 35 weeks gestation of pregnancy: Secondary | ICD-10-CM

## 2023-01-20 DIAGNOSIS — Z98891 History of uterine scar from previous surgery: Secondary | ICD-10-CM

## 2023-01-20 DIAGNOSIS — O0993 Supervision of high risk pregnancy, unspecified, third trimester: Secondary | ICD-10-CM

## 2023-01-20 DIAGNOSIS — O09523 Supervision of elderly multigravida, third trimester: Secondary | ICD-10-CM

## 2023-01-20 DIAGNOSIS — O0990 Supervision of high risk pregnancy, unspecified, unspecified trimester: Secondary | ICD-10-CM

## 2023-01-20 DIAGNOSIS — Z758 Other problems related to medical facilities and other health care: Secondary | ICD-10-CM

## 2023-01-20 LAB — POCT URINALYSIS DIP (DEVICE)
Bilirubin Urine: NEGATIVE
Glucose, UA: NEGATIVE mg/dL
Ketones, ur: NEGATIVE mg/dL
Leukocytes,Ua: NEGATIVE
Nitrite: NEGATIVE
Protein, ur: NEGATIVE mg/dL
Specific Gravity, Urine: 1.015 (ref 1.005–1.030)
Urobilinogen, UA: 0.2 mg/dL (ref 0.0–1.0)
pH: 5.5 (ref 5.0–8.0)

## 2023-01-20 NOTE — Addendum Note (Signed)
Addended by: Brien Mates T on: 01/20/2023 11:25 AM   Modules accepted: Orders

## 2023-01-20 NOTE — Progress Notes (Signed)
   PRENATAL VISIT NOTE  Subjective:  Michele Gomez is a 37 y.o. [phone] at [redacted]w[redacted]d being seen today for ongoing prenatal care.  She is currently monitored for the following issues for this high-risk pregnancy and has Supervision of high risk pregnancy, antepartum; History of cesarean section; AMA (advanced maternal age) multigravida 35+; Language barrier; and Abnormal Pap smear of cervix on their problem list.  Patient reports no bleeding, no leaking, and lots of pelvic pressure .  Contractions: Irritability. Vag. Bleeding: None.  Movement: Present. Denies leaking of fluid.   The following portions of the patient's history were reviewed and updated as appropriate: allergies, current medications, past family history, past medical history, past social history, past surgical history and problem list.   Objective:   Vitals:   01/20/23 0931  BP: 119/80  Pulse: 96  Weight: 162 lb 12.8 oz (73.8 kg)    Fetal Status: Fetal Heart Rate (bpm): 147   Movement: Present     General:  Alert, oriented and cooperative. Patient is in no acute distress.  Skin: Skin is warm and dry. No rash noted.   Cardiovascular: Normal heart rate noted  Respiratory: Normal respiratory effort, no problems with respiration noted  Abdomen: Soft, gravid, appropriate for gestational age.  Pain/Pressure: Present     Pelvic: Cervical exam offered declined x 2        Extremities: Normal range of motion.  Edema: None  Mental Status: Normal mood and affect. Normal behavior. Normal judgment and thought content.   Assessment and Plan:  Pregnancy: G4P3003 at [redacted]w[redacted]d 1. Supervision of high risk pregnancy, antepartum - ROB 1 week  2. Language barrier - arabic interpreter used  3. History of cesarean section - Planned repeat 5/20  4. Multigravida of advanced maternal age in third trimester   5. [redacted] weeks gestation of pregnancy - PTL precautions  Preterm labor symptoms and general obstetric precautions  including but not limited to vaginal bleeding, contractions, leaking of fluid and fetal movement were reviewed in detail with the patient. Please refer to After Visit Summary for other counseling recommendations.   Return in about 1 week (around 01/27/2023) for ROB.  Future Appointments  Date Time Provider Department Center  01/30/2023 10:15 AM Warden Fillers, MD [facility]    Dorathy [place]insman, [place]

## 2023-01-26 LAB — URINE CULTURE, OB REFLEX

## 2023-01-26 LAB — CULTURE, OB URINE

## 2023-01-30 ENCOUNTER — Encounter: Payer: Medicaid Other | Admitting: Obstetrics and Gynecology

## 2023-02-02 ENCOUNTER — Ambulatory Visit (INDEPENDENT_AMBULATORY_CARE_PROVIDER_SITE_OTHER): Payer: Medicaid Other | Admitting: Family Medicine

## 2023-02-02 ENCOUNTER — Other Ambulatory Visit (HOSPITAL_COMMUNITY)
Admission: RE | Admit: 2023-02-02 | Discharge: 2023-02-02 | Disposition: A | Discharge status: Home / Self Care | Payer: Medicaid Other | Source: Ambulatory Visit | Attending: Obstetrics and Gynecology | Admitting: Obstetrics and Gynecology

## 2023-02-02 ENCOUNTER — Other Ambulatory Visit: Payer: Self-pay

## 2023-02-02 VITALS — BP 120/82 | HR 89 | Wt 167.1 lb

## 2023-02-02 DIAGNOSIS — O0990 Supervision of high risk pregnancy, unspecified, unspecified trimester: Secondary | ICD-10-CM | POA: Diagnosis present

## 2023-02-02 DIAGNOSIS — O09523 Supervision of elderly multigravida, third trimester: Secondary | ICD-10-CM

## 2023-02-02 DIAGNOSIS — Z758 Other problems related to medical facilities and other health care: Secondary | ICD-10-CM

## 2023-02-02 DIAGNOSIS — Z603 Acculturation difficulty: Secondary | ICD-10-CM

## 2023-02-02 DIAGNOSIS — Z3A37 37 weeks gestation of pregnancy: Secondary | ICD-10-CM

## 2023-02-02 DIAGNOSIS — Z98891 History of uterine scar from previous surgery: Secondary | ICD-10-CM

## 2023-02-05 LAB — CULTURE, BETA STREP (GROUP B ONLY): Strep Gp B Culture: POSITIVE — AB

## 2023-02-05 NOTE — Progress Notes (Signed)
   PRENATAL VISIT NOTE  Subjective:  Michele Gomez is a 37 y.o. [phone] at [redacted]w[redacted]d being seen today for ongoing prenatal care.  She is currently monitored for the following issues for this low-risk pregnancy and has Supervision of high risk pregnancy, antepartum; History of cesarean section; AMA (advanced maternal age) multigravida 35+; Language barrier; and Abnormal Pap smear of cervix on their problem list.  Patient reports no bleeding, no cramping, no leaking, and occasional contractions.  Contractions: Not present. Vag. Bleeding: None.  Movement: Present. Denies leaking of fluid.   The following portions of the patient's history were reviewed and updated as appropriate: allergies, current medications, past family history, past medical history, past social history, past surgical history and problem list.   Objective:   Vitals:   02/02/23 1543  BP: 120/82  Pulse: 89  Weight: 167 lb 1.6 oz (75.8 kg)    Fetal Status: Fetal Heart Rate (bpm): 150   Movement: Present     General:  Alert, oriented and cooperative. Patient is in no acute distress.  Skin: Skin is warm and dry. No rash noted.   Cardiovascular: Normal heart rate noted  Respiratory: Normal respiratory effort, no problems with respiration noted  Abdomen: Soft, gravid, appropriate for gestational age.  Pain/Pressure: Present     Pelvic: Cervical exam deferred        Extremities: Normal range of motion.  Edema: None  Mental Status: Normal mood and affect. Normal behavior. Normal judgment and thought content.   Assessment and Plan:  Pregnancy: G4P3003 at [redacted]w[redacted]d 1. Supervision of high risk pregnancy, antepartum Continue routine prenatal care - Culture, beta strep (group b only) - Cervicovaginal ancillary only  2. Language barrier Interpreter used for visit  3. History of cesarean section Repeat CS scheduled 5/20  4. Multigravida of advanced maternal age in third trimester  5. [redacted] weeks gestation of  pregnancy Discussed term labor precautions and MAU precautions  Term labor symptoms and general obstetric precautions including but not limited to vaginal bleeding, contractions, leaking of fluid and fetal movement were reviewed in detail with the patient. Please refer to After Visit Summary for other counseling recommendations.   No follow-ups on file.  Future Appointments  Date Time Provider Department Center  02/10/2023  9:55 AM Carlynn Herald, CNM [facility]    Celedonio Savage, MD

## 2023-02-06 ENCOUNTER — Encounter (HOSPITAL_COMMUNITY): Payer: Self-pay

## 2023-02-06 LAB — CERVICOVAGINAL ANCILLARY ONLY
Chlamydia: NEGATIVE
Comment: NEGATIVE
Comment: NORMAL
Neisseria Gonorrhea: NEGATIVE

## 2023-02-06 NOTE — Pre-Procedure Instructions (Signed)
Interpreter number [phone]

## 2023-02-06 NOTE — Patient Instructions (Signed)
Michele Gomez  02/06/2023   Your procedure is scheduled on:  02/13/2023  Arrive at 0730 at Entrance C on [address] at [facility]  and [employer]. You are invited to use the FREE valet parking or use the Visitor's parking deck.  Pick up the phone at the desk and dial [phone].  Call this number if you have problems the morning of surgery: [phone]  Remember:   Do not eat food:(After Midnight) Desps de medianoche.  Do not drink clear liquids: (After Midnight) Desps de medianoche.  Take these medicines the morning of surgery with A SIP OF WATER:  none   Do not wear jewelry, make-up or nail polish.  Do not wear lotions, powders, or perfumes. Do not wear deodorant.  Do not shave 48 hours prior to surgery.  Do not bring valuables to the hospital.  [facility] is not   responsible for any belongings or valuables brought to the hospital.  Contacts, dentures or bridgework may not be worn into surgery.  Leave suitcase in the car. After surgery it may be brought to your room.  For patients admitted to the hospital, checkout time is 11:00 AM the day of              discharge.      Please read over the following fact sheets that you were given:     Preparing for Surgery

## 2023-02-07 ENCOUNTER — Encounter: Payer: Self-pay | Admitting: Advanced Practice Midwife

## 2023-02-07 ENCOUNTER — Encounter: Payer: Self-pay | Admitting: Lactation Services

## 2023-02-07 DIAGNOSIS — O99820 Streptococcus B carrier state complicating pregnancy: Secondary | ICD-10-CM | POA: Insufficient documentation

## 2023-02-10 ENCOUNTER — Ambulatory Visit (INDEPENDENT_AMBULATORY_CARE_PROVIDER_SITE_OTHER): Payer: Medicaid Other | Admitting: Certified Nurse Midwife

## 2023-02-10 ENCOUNTER — Ambulatory Visit (HOSPITAL_COMMUNITY)
Admission: RE | Admit: 2023-02-10 | Discharge: 2023-02-10 | Disposition: A | Discharge status: Home / Self Care | Payer: Medicaid Other | Source: Ambulatory Visit | Attending: Family Medicine | Admitting: Family Medicine

## 2023-02-10 VITALS — BP 118/75 | HR 77 | Wt 169.5 lb

## 2023-02-10 DIAGNOSIS — O0990 Supervision of high risk pregnancy, unspecified, unspecified trimester: Secondary | ICD-10-CM | POA: Diagnosis not present

## 2023-02-10 DIAGNOSIS — Z3A33 33 weeks gestation of pregnancy: Secondary | ICD-10-CM

## 2023-02-10 DIAGNOSIS — Z3A38 38 weeks gestation of pregnancy: Secondary | ICD-10-CM

## 2023-02-10 DIAGNOSIS — Z758 Other problems related to medical facilities and other health care: Secondary | ICD-10-CM

## 2023-02-10 DIAGNOSIS — O0993 Supervision of high risk pregnancy, unspecified, third trimester: Secondary | ICD-10-CM

## 2023-02-10 DIAGNOSIS — Z98891 History of uterine scar from previous surgery: Secondary | ICD-10-CM

## 2023-02-10 DIAGNOSIS — O09523 Supervision of elderly multigravida, third trimester: Secondary | ICD-10-CM

## 2023-02-10 DIAGNOSIS — Z603 Acculturation difficulty: Secondary | ICD-10-CM

## 2023-02-10 LAB — COMPREHENSIVE METABOLIC PANEL
ALT: 19 U/L (ref 0–44)
AST: 29 U/L (ref 15–41)
Albumin: 2.8 g/dL — ABNORMAL LOW (ref 3.5–5.0)
Alkaline Phosphatase: 127 U/L — ABNORMAL HIGH (ref 38–126)
Anion gap: 10 (ref 5–15)
BUN: 5 mg/dL — ABNORMAL LOW (ref 6–20)
CO2: 20 mmol/L — ABNORMAL LOW (ref 22–32)
Calcium: 9.1 mg/dL (ref 8.9–10.3)
Chloride: 107 mmol/L (ref 98–111)
Creatinine, Ser: 0.49 mg/dL (ref 0.44–1.00)
GFR, Estimated: >60 mL/min (ref >60)
Glucose, Bld: 118 mg/dL — ABNORMAL HIGH (ref 70–99)
Potassium: 3.4 mmol/L — ABNORMAL LOW (ref 3.5–5.1)
Sodium: 137 mmol/L (ref 135–145)
Total Bilirubin: 0.4 mg/dL (ref 0.3–1.2)
Total Protein: 5.7 g/dL — ABNORMAL LOW (ref 6.5–8.1)

## 2023-02-10 LAB — CBC
HCT: 36 % (ref 36.0–46.0)
Hemoglobin: 12.4 g/dL (ref 12.0–15.0)
MCH: 31.1 pg (ref 26.0–34.0)
MCHC: 34.4 g/dL (ref 30.0–36.0)
MCV: 90.2 fL (ref 80.0–100.0)
Platelets: 183 10*3/uL (ref 150–400)
RBC: 3.99 MIL/uL (ref 3.87–5.11)
RDW: 12.4 % (ref 11.5–15.5)
WBC: 5.7 10*3/uL (ref 4.0–10.5)
nRBC: 0 % (ref 0.0–0.2)

## 2023-02-11 LAB — RPR: RPR Ser Ql: NONREACTIVE

## 2023-02-12 NOTE — Progress Notes (Signed)
   PRENATAL VISIT NOTE  Subjective:  Michele Gomez is a 37 y.o. [phone] at [redacted]w[redacted]d being seen today for ongoing prenatal care.  She is currently monitored for the following issues for this high-risk pregnancy and has Supervision of high risk pregnancy, antepartum; History of cesarean section; AMA (advanced maternal age) multigravida 35+; Language barrier; Abnormal Pap smear of cervix; and Group B Streptococcus carrier, +RV culture, currently pregnant on their problem list.  Patient reports no complaints.  Contractions: Irritability. Vag. Bleeding: None.  Movement: Present. Denies leaking of fluid.   The following portions of the patient's history were reviewed and updated as appropriate: allergies, current medications, past family history, past medical history, past social history, past surgical history and problem list.   Objective:   Vitals:   02/10/23 1033  BP: 118/75  Pulse: 77  Weight: 169 lb 8 oz (76.9 kg)    Fetal Status: Fetal Heart Rate (bpm): 135 Fundal Height: 38 cm Movement: Present     General:  Alert, oriented and cooperative. Patient is in no acute distress.  Skin: Skin is warm and dry. No rash noted.   Cardiovascular: Normal heart rate noted  Respiratory: Normal respiratory effort, no problems with respiration noted  Abdomen: Soft, gravid, appropriate for gestational age.  Pain/Pressure: Present     Pelvic: Cervical exam deferred        Extremities: Normal range of motion.  Edema: Trace  Mental Status: Normal mood and affect. Normal behavior. Normal judgment and thought content.   Assessment and Plan:  Pregnancy: G4P3003 at [redacted]w[redacted]d 1. Supervision of high risk pregnancy in third trimester - Patient doing well. She reports frequent and vigorous fetal movement.   2. History of cesarean section - Repeat C/S planned for Monday 5/20 - Briefly discussed a spinal and recovery time and pain management following delivery.   3. Multigravida of advanced maternal  age in third trimester   4. [redacted] weeks gestation of pregnancy - Fundal Height Appropriate for gestational age  - Patient desires Nexplanon for postpartum contraception.   5. Language barrier - Due to language barrier, an interpreter was present during the history-taking and subsequent discussion (and for part of the physical exam) with this patient. - In person Arabic interpreter used for the duration of this visit.   Term labor symptoms and general obstetric precautions including but not limited to vaginal bleeding, contractions, leaking of fluid and fetal movement were reviewed in detail with the patient. Please refer to After Visit Summary for other counseling recommendations.   Return in about 1 week (around 02/17/2023) for LOB.  No future appointments.  Mervin Ramires Danella Deis) Suzie Portela, MSN, CNM  Center for [facility]  02/12/23 11:16 PM

## 2023-02-12 NOTE — Anesthesia Preprocedure Evaluation (Signed)
Anesthesia Evaluation  Patient identified by MRN, date of birth, ID band Patient awake    Reviewed: Allergy & Precautions, NPO status , Patient's Chart, lab work & pertinent test results  Airway Mallampati: II  TM Distance: >3 FB Neck ROM: Full    Dental  (+) Teeth Intact, Dental Advisory Given   Pulmonary neg pulmonary ROS   Pulmonary exam normal breath sounds clear to auscultation       Cardiovascular negative cardio ROS Normal cardiovascular exam Rhythm:Regular Rate:Normal     Neuro/Psych negative neurological ROS     GI/Hepatic Neg liver ROS,GERD  Medicated,,  Endo/Other  negative endocrine ROS  Plt 183k on 5/17  Renal/GU negative Renal ROS     Musculoskeletal negative musculoskeletal ROS (+)    Abdominal   Peds  Hematology negative hematology ROS (+)   Anesthesia Other Findings   Reproductive/Obstetrics (+) Pregnancy C/S x3                             Anesthesia Physical Anesthesia Plan  ASA: 2  Anesthesia Plan: Spinal   Post-op Pain Management: Toradol IV (intra-op)*   Induction: Intravenous  PONV Risk Score and Plan: 2 and Dexamethasone, Ondansetron and Scopolamine patch - Pre-op  Airway Management Planned: Natural Airway  Additional Equipment:   Intra-op Plan:   Post-operative Plan:   Informed Consent: I have reviewed the patients History and Physical, chart, labs and discussed the procedure including the risks, benefits and alternatives for the proposed anesthesia with the patient or authorized representative who has indicated his/her understanding and acceptance.     Interpreter used for [employer] and Sales promotion account executive given  Plan Discussed with: CRNA  Anesthesia Plan Comments:        Anesthesia Quick Evaluation

## 2023-02-13 ENCOUNTER — Encounter (HOSPITAL_COMMUNITY): Payer: Self-pay | Admitting: Family Medicine

## 2023-02-13 ENCOUNTER — Other Ambulatory Visit: Payer: Self-pay

## 2023-02-13 ENCOUNTER — Inpatient Hospital Stay (HOSPITAL_COMMUNITY)
Admission: RE | Admit: 2023-02-13 | Discharge: 2023-02-15 | DRG: 788 | Disposition: A | Discharge status: Home / Self Care | Payer: Medicaid Other | Attending: Family Medicine | Admitting: Family Medicine

## 2023-02-13 ENCOUNTER — Inpatient Hospital Stay (HOSPITAL_COMMUNITY): Payer: Medicaid Other | Admitting: Certified Registered Nurse Anesthetist

## 2023-02-13 ENCOUNTER — Encounter (HOSPITAL_COMMUNITY)
Admission: RE | Disposition: A | Discharge status: Home / Self Care | Payer: Self-pay | Source: Home / Self Care | Attending: Family Medicine

## 2023-02-13 DIAGNOSIS — Z30017 Encounter for initial prescription of implantable subdermal contraceptive: Secondary | ICD-10-CM | POA: Diagnosis not present

## 2023-02-13 DIAGNOSIS — D649 Anemia, unspecified: Secondary | ICD-10-CM | POA: Diagnosis not present

## 2023-02-13 DIAGNOSIS — O99820 Streptococcus B carrier state complicating pregnancy: Secondary | ICD-10-CM

## 2023-02-13 DIAGNOSIS — Z975 Presence of (intrauterine) contraceptive device: Secondary | ICD-10-CM

## 2023-02-13 DIAGNOSIS — O34211 Maternal care for low transverse scar from previous cesarean delivery: Secondary | ICD-10-CM | POA: Diagnosis present

## 2023-02-13 DIAGNOSIS — O09529 Supervision of elderly multigravida, unspecified trimester: Secondary | ICD-10-CM

## 2023-02-13 DIAGNOSIS — O0990 Supervision of high risk pregnancy, unspecified, unspecified trimester: Secondary | ICD-10-CM

## 2023-02-13 DIAGNOSIS — R87619 Unspecified abnormal cytological findings in specimens from cervix uteri: Secondary | ICD-10-CM | POA: Diagnosis present

## 2023-02-13 DIAGNOSIS — Z603 Acculturation difficulty: Secondary | ICD-10-CM | POA: Diagnosis present

## 2023-02-13 DIAGNOSIS — Z3A39 39 weeks gestation of pregnancy: Secondary | ICD-10-CM

## 2023-02-13 DIAGNOSIS — O99824 Streptococcus B carrier state complicating childbirth: Secondary | ICD-10-CM | POA: Diagnosis present

## 2023-02-13 DIAGNOSIS — O34593 Maternal care for other abnormalities of gravid uterus, third trimester: Secondary | ICD-10-CM

## 2023-02-13 DIAGNOSIS — O09523 Supervision of elderly multigravida, third trimester: Secondary | ICD-10-CM | POA: Diagnosis not present

## 2023-02-13 DIAGNOSIS — O9902 Anemia complicating childbirth: Secondary | ICD-10-CM | POA: Diagnosis present

## 2023-02-13 DIAGNOSIS — Z98891 History of uterine scar from previous surgery: Secondary | ICD-10-CM

## 2023-02-13 LAB — ABO/RH: ABO/RH(D): B POS

## 2023-02-13 LAB — BPAM RBC
Blood Product Expiration Date: 202406042359
ISSUE DATE / TIME: 202405160752
ISSUE DATE / TIME: 202405160752
Unit Type and Rh: 7300

## 2023-02-13 LAB — PREPARE RBC (CROSSMATCH)

## 2023-02-13 SURGERY — Surgical Case
Anesthesia: Spinal

## 2023-02-13 MED ORDER — MORPHINE SULFATE (PF) 0.5 MG/ML IJ SOLN: INTRAMUSCULAR | Status: DC | PRN

## 2023-02-13 MED ORDER — FENTANYL CITRATE (PF) 100 MCG/2ML IJ SOLN: INTRAMUSCULAR | Status: DC | PRN

## 2023-02-13 MED ORDER — ORAL CARE MOUTH RINSE: 15.0000 mL | Freq: Once | OROMUCOSAL | Status: AC

## 2023-02-13 MED ORDER — SODIUM CHLORIDE 0.9 % IV SOLN: INTRAVENOUS | Status: DC | PRN

## 2023-02-13 MED ORDER — LACTATED RINGERS IV SOLN: INTRAVENOUS | Status: DC

## 2023-02-13 MED ORDER — SCOPOLAMINE 1 MG/3DAYS TD PT72
MEDICATED_PATCH | TRANSDERMAL | Status: AC
  Filled 2023-02-13: qty 1

## 2023-02-13 MED ORDER — NALOXONE HCL 4 MG/10ML IJ SOLN: 1.0000 ug/kg/h | INTRAVENOUS | Status: DC | PRN

## 2023-02-13 MED ORDER — PROMETHAZINE HCL 25 MG/ML IJ SOLN: 6.2500 mg | INTRAMUSCULAR | Status: DC | PRN

## 2023-02-13 MED ORDER — SIMETHICONE 80 MG PO CHEW
80.0000 mg | CHEWABLE_TABLET | Freq: Three times a day (TID) | ORAL | Status: DC
  Administered 2023-02-13 – 2023-02-15 (×5): 80 mg via ORAL
  Filled 2023-02-13 (×6): qty 1

## 2023-02-13 MED ORDER — BUPIVACAINE IN DEXTROSE 0.75-8.25 % IT SOLN
INTRATHECAL | Status: DC | PRN
  Administered 2023-02-13: 1.6 mL via INTRATHECAL

## 2023-02-13 MED ORDER — CHLORHEXIDINE GLUCONATE 0.12 % MT SOLN
15.0000 mL | Freq: Once | OROMUCOSAL | Status: AC
  Administered 2023-02-13: 15 mL via OROMUCOSAL

## 2023-02-13 MED ORDER — CEFAZOLIN SODIUM-DEXTROSE 2-4 GM/100ML-% IV SOLN
2.0000 g | INTRAVENOUS | Status: AC
  Administered 2023-02-13: 2 g via INTRAVENOUS

## 2023-02-13 MED ORDER — TETANUS-DIPHTH-ACELL PERTUSSIS 5-2.5-18.5 LF-MCG/0.5 IM SUSY: 0.5000 mL | PREFILLED_SYRINGE | Freq: Once | INTRAMUSCULAR | Status: DC

## 2023-02-13 MED ORDER — DIPHENHYDRAMINE HCL 25 MG PO CAPS
25.0000 mg | ORAL_CAPSULE | Freq: Four times a day (QID) | ORAL | Status: DC | PRN
  Administered 2023-02-13 – 2023-02-14 (×2): 25 mg via ORAL
  Filled 2023-02-13 (×2): qty 1

## 2023-02-13 MED ORDER — NALOXONE HCL 0.4 MG/ML IJ SOLN: 0.4000 mg | INTRAMUSCULAR | Status: DC | PRN

## 2023-02-13 MED ORDER — OXYTOCIN-SODIUM CHLORIDE 30-0.9 UT/500ML-% IV SOLN
INTRAVENOUS | Status: DC | PRN
  Administered 2023-02-13: 30 [IU] via INTRAVENOUS

## 2023-02-13 MED ORDER — KETOROLAC TROMETHAMINE 30 MG/ML IJ SOLN: 30.0000 mg | Freq: Four times a day (QID) | INTRAMUSCULAR | Status: DC | PRN

## 2023-02-13 MED ORDER — SOD CITRATE-CITRIC ACID 500-334 MG/5ML PO SOLN
ORAL | Status: AC
  Filled 2023-02-13: qty 30

## 2023-02-13 MED ORDER — ONDANSETRON HCL 4 MG/2ML IJ SOLN
INTRAMUSCULAR | Status: DC | PRN
  Administered 2023-02-13: 4 mg via INTRAVENOUS

## 2023-02-13 MED ORDER — TRANEXAMIC ACID-NACL 1000-0.7 MG/100ML-% IV SOLN
INTRAVENOUS | Status: AC
  Filled 2023-02-13: qty 100

## 2023-02-13 MED ORDER — IBUPROFEN 600 MG PO TABS: 600.0000 mg | ORAL_TABLET | Freq: Four times a day (QID) | ORAL | Status: DC

## 2023-02-13 MED ORDER — TRANEXAMIC ACID-NACL 1000-0.7 MG/100ML-% IV SOLN
INTRAVENOUS | Status: DC | PRN
  Administered 2023-02-13: 1000 mg via INTRAVENOUS

## 2023-02-13 MED ORDER — DEXAMETHASONE SODIUM PHOSPHATE 4 MG/ML IJ SOLN
INTRAMUSCULAR | Status: AC
  Filled 2023-02-13: qty 1

## 2023-02-13 MED ORDER — PHENYLEPHRINE HCL-NACL 20-0.9 MG/250ML-% IV SOLN
INTRAVENOUS | Status: DC | PRN
  Administered 2023-02-13: 60 ug/min via INTRAVENOUS

## 2023-02-13 MED ORDER — ONDANSETRON HCL 4 MG/2ML IJ SOLN: 4.0000 mg | Freq: Three times a day (TID) | INTRAMUSCULAR | Status: DC | PRN

## 2023-02-13 MED ORDER — ACETAMINOPHEN 10 MG/ML IV SOLN
INTRAVENOUS | Status: AC
  Filled 2023-02-13: qty 100

## 2023-02-13 MED ORDER — SENNOSIDES-DOCUSATE SODIUM 8.6-50 MG PO TABS
2.0000 | ORAL_TABLET | Freq: Every day | ORAL | Status: DC
  Administered 2023-02-14 – 2023-02-15 (×2): 2 via ORAL
  Filled 2023-02-13 (×2): qty 2

## 2023-02-13 MED ORDER — DIPHENHYDRAMINE HCL 50 MG/ML IJ SOLN: 12.5000 mg | INTRAMUSCULAR | Status: DC | PRN

## 2023-02-13 MED ORDER — MENTHOL 3 MG MT LOZG: 1.0000 | LOZENGE | OROMUCOSAL | Status: DC | PRN

## 2023-02-13 MED ORDER — GABAPENTIN 100 MG PO CAPS
200.0000 mg | ORAL_CAPSULE | Freq: Every day | ORAL | Status: DC
  Administered 2023-02-13 – 2023-02-14 (×2): 200 mg via ORAL
  Filled 2023-02-13 (×2): qty 2

## 2023-02-13 MED ORDER — TRANEXAMIC ACID-NACL 1000-0.7 MG/100ML-% IV SOLN
1000.0000 mg | Freq: Once | INTRAVENOUS | Status: AC
  Administered 2023-02-13: 1000 mg via INTRAVENOUS

## 2023-02-13 MED ORDER — OXYTOCIN-SODIUM CHLORIDE 30-0.9 UT/500ML-% IV SOLN
INTRAVENOUS | Status: AC
  Filled 2023-02-13: qty 500

## 2023-02-13 MED ORDER — ACETAMINOPHEN 500 MG PO TABS
1000.0000 mg | ORAL_TABLET | Freq: Four times a day (QID) | ORAL | Status: DC
  Administered 2023-02-13 – 2023-02-15 (×6): 1000 mg via ORAL
  Filled 2023-02-13 (×7): qty 2

## 2023-02-13 MED ORDER — KETOROLAC TROMETHAMINE 30 MG/ML IJ SOLN
30.0000 mg | Freq: Four times a day (QID) | INTRAMUSCULAR | Status: DC
  Administered 2023-02-13: 30 mg via INTRAVENOUS
  Filled 2023-02-13 (×2): qty 1

## 2023-02-13 MED ORDER — ACETAMINOPHEN 10 MG/ML IV SOLN
INTRAVENOUS | Status: DC | PRN
  Administered 2023-02-13: 1000 mg via INTRAVENOUS

## 2023-02-13 MED ORDER — KETOROLAC TROMETHAMINE 30 MG/ML IJ SOLN
INTRAMUSCULAR | Status: AC
  Filled 2023-02-13: qty 1

## 2023-02-13 MED ORDER — COCONUT OIL OIL: 1.0000 | TOPICAL_OIL | Status: DC | PRN

## 2023-02-13 MED ORDER — MISOPROSTOL 200 MCG PO TABS
800.0000 ug | ORAL_TABLET | Freq: Once | ORAL | Status: AC
  Administered 2023-02-13: 800 ug via ORAL

## 2023-02-13 MED ORDER — MORPHINE SULFATE (PF) 0.5 MG/ML IJ SOLN
INTRAMUSCULAR | Status: AC
  Filled 2023-02-13: qty 10

## 2023-02-13 MED ORDER — SODIUM CHLORIDE 0.9% FLUSH: 3.0000 mL | INTRAVENOUS | Status: DC | PRN

## 2023-02-13 MED ORDER — ENOXAPARIN SODIUM 40 MG/0.4ML IJ SOSY
40.0000 mg | PREFILLED_SYRINGE | INTRAMUSCULAR | Status: DC
  Administered 2023-02-14 – 2023-02-15 (×2): 40 mg via SUBCUTANEOUS
  Filled 2023-02-13 (×2): qty 0.4

## 2023-02-13 MED ORDER — OXYCODONE HCL 5 MG PO TABS: 5.0000 mg | ORAL_TABLET | ORAL | Status: DC | PRN

## 2023-02-13 MED ORDER — POVIDONE-IODINE 10 % EX SWAB
2.0000 | Freq: Once | CUTANEOUS | Status: AC
  Administered 2023-02-13: 2 via TOPICAL

## 2023-02-13 MED ORDER — SCOPOLAMINE 1 MG/3DAYS TD PT72
1.0000 | MEDICATED_PATCH | Freq: Once | TRANSDERMAL | Status: DC
  Administered 2023-02-13: 1.5 mg via TRANSDERMAL

## 2023-02-13 MED ORDER — MEPERIDINE HCL 25 MG/ML IJ SOLN: 6.2500 mg | INTRAMUSCULAR | Status: DC | PRN

## 2023-02-13 MED ORDER — FAMOTIDINE 20 MG PO TABS
20.0000 mg | ORAL_TABLET | Freq: Once | ORAL | Status: AC
  Administered 2023-02-13: 20 mg via ORAL

## 2023-02-13 MED ORDER — FENTANYL CITRATE (PF) 100 MCG/2ML IJ SOLN
INTRAMUSCULAR | Status: DC | PRN
  Administered 2023-02-13: 15 ug via INTRATHECAL

## 2023-02-13 MED ORDER — SOD CITRATE-CITRIC ACID 500-334 MG/5ML PO SOLN
30.0000 mL | Freq: Once | ORAL | Status: AC
  Administered 2023-02-13: 30 mL via ORAL

## 2023-02-13 MED ORDER — SODIUM CHLORIDE 0.9% IV SOLUTION: Freq: Once | INTRAVENOUS | Status: DC

## 2023-02-13 MED ORDER — METHYLERGONOVINE MALEATE 0.2 MG/ML IJ SOLN
INTRAMUSCULAR | Status: AC
  Filled 2023-02-13: qty 1

## 2023-02-13 MED ORDER — MISOPROSTOL 200 MCG PO TABS
ORAL_TABLET | ORAL | Status: AC
  Filled 2023-02-13: qty 4

## 2023-02-13 MED ORDER — SCOPOLAMINE 1 MG/3DAYS TD PT72: 1.0000 | MEDICATED_PATCH | Freq: Once | TRANSDERMAL | Status: DC

## 2023-02-13 MED ORDER — MORPHINE SULFATE (PF) 0.5 MG/ML IJ SOLN
INTRAMUSCULAR | Status: DC | PRN
  Administered 2023-02-13: .15 mg via INTRATHECAL

## 2023-02-13 MED ORDER — FENTANYL CITRATE (PF) 100 MCG/2ML IJ SOLN: 25.0000 ug | INTRAMUSCULAR | Status: DC | PRN

## 2023-02-13 MED ORDER — SOD CITRATE-CITRIC ACID 500-334 MG/5ML PO SOLN: 30.0000 mL | ORAL | Status: DC

## 2023-02-13 MED ORDER — ZOLPIDEM TARTRATE 5 MG PO TABS: 5.0000 mg | ORAL_TABLET | Freq: Every evening | ORAL | Status: DC | PRN

## 2023-02-13 MED ORDER — METHYLERGONOVINE MALEATE 0.2 MG PO TABS
0.2000 mg | ORAL_TABLET | Freq: Four times a day (QID) | ORAL | Status: AC
  Administered 2023-02-13 – 2023-02-14 (×4): 0.2 mg via ORAL
  Filled 2023-02-13 (×4): qty 1

## 2023-02-13 MED ORDER — ONDANSETRON HCL 4 MG/2ML IJ SOLN
INTRAMUSCULAR | Status: AC
  Filled 2023-02-13: qty 2

## 2023-02-13 MED ORDER — DEXAMETHASONE SODIUM PHOSPHATE 4 MG/ML IJ SOLN
INTRAMUSCULAR | Status: DC | PRN
  Administered 2023-02-13: 4 mg via INTRAVENOUS

## 2023-02-13 MED ORDER — DIBUCAINE (PERIANAL) 1 % EX OINT: 1.0000 | TOPICAL_OINTMENT | CUTANEOUS | Status: DC | PRN

## 2023-02-13 MED ORDER — KETOROLAC TROMETHAMINE 30 MG/ML IJ SOLN
30.0000 mg | Freq: Four times a day (QID) | INTRAMUSCULAR | Status: DC | PRN
  Administered 2023-02-13: 30 mg via INTRAVENOUS

## 2023-02-13 MED ORDER — CEFAZOLIN SODIUM-DEXTROSE 2-4 GM/100ML-% IV SOLN
INTRAVENOUS | Status: AC
  Filled 2023-02-13: qty 100

## 2023-02-13 MED ORDER — ACETAMINOPHEN 500 MG PO TABS: 1000.0000 mg | ORAL_TABLET | Freq: Four times a day (QID) | ORAL | Status: DC

## 2023-02-13 MED ORDER — DIPHENHYDRAMINE HCL 25 MG PO CAPS: 25.0000 mg | ORAL_CAPSULE | ORAL | Status: DC | PRN

## 2023-02-13 MED ORDER — METHYLERGONOVINE MALEATE 0.2 MG/ML IJ SOLN
0.2000 mg | Freq: Once | INTRAMUSCULAR | Status: AC
  Administered 2023-02-13: 0.2 mg via INTRAMUSCULAR

## 2023-02-13 MED ORDER — SIMETHICONE 80 MG PO CHEW
80.0000 mg | CHEWABLE_TABLET | ORAL | Status: DC | PRN
  Administered 2023-02-14: 80 mg via ORAL

## 2023-02-13 MED ORDER — PRENATAL MULTIVITAMIN CH
1.0000 | ORAL_TABLET | Freq: Every day | ORAL | Status: DC
  Administered 2023-02-14 – 2023-02-15 (×2): 1 via ORAL
  Filled 2023-02-13 (×2): qty 1

## 2023-02-13 MED ORDER — FENTANYL CITRATE (PF) 100 MCG/2ML IJ SOLN
INTRAMUSCULAR | Status: AC
  Filled 2023-02-13: qty 2

## 2023-02-13 MED ORDER — CHLORHEXIDINE GLUCONATE 0.12 % MT SOLN
OROMUCOSAL | Status: AC
  Filled 2023-02-13: qty 15

## 2023-02-13 MED ORDER — FAMOTIDINE 20 MG PO TABS
ORAL_TABLET | ORAL | Status: AC
  Filled 2023-02-13: qty 1

## 2023-02-13 MED ORDER — OXYTOCIN-SODIUM CHLORIDE 30-0.9 UT/500ML-% IV SOLN: 2.5000 [IU]/h | INTRAVENOUS | Status: AC

## 2023-02-13 MED ORDER — WITCH HAZEL-GLYCERIN EX PADS: 1.0000 | MEDICATED_PAD | CUTANEOUS | Status: DC | PRN

## 2023-02-13 SURGICAL SUPPLY — 35 items
ADH SKN CLS APL DERMABOND .7 (GAUZE/BANDAGES/DRESSINGS) ×1
APL PRP STRL LF DISP 70% ISPRP (MISCELLANEOUS) ×2
CHLORAPREP W/TINT 26 (MISCELLANEOUS) ×2 IMPLANT
CLAMP UMBILICAL CORD (MISCELLANEOUS) ×1 IMPLANT
CLOTH BEACON ORANGE TIMEOUT ST (SAFETY) ×1 IMPLANT
DERMABOND ADVANCED .7 DNX12 (GAUZE/BANDAGES/DRESSINGS) IMPLANT
DRSG OPSITE POSTOP 4X10 (GAUZE/BANDAGES/DRESSINGS) ×1 IMPLANT
ELECT REM PT RETURN 9FT ADLT (ELECTROSURGICAL) ×1
ELECTRODE REM PT RTRN 9FT ADLT (ELECTROSURGICAL) ×1 IMPLANT
EXTRACTOR VACUUM BELL STYLE (SUCTIONS) IMPLANT
GAUZE SPONGE 4X4 12PLY STRL LF (GAUZE/BANDAGES/DRESSINGS) IMPLANT
GLOVE BIOGEL PI IND STRL 7.0 (GLOVE) ×2 IMPLANT
GLOVE ECLIPSE 7.0 STRL STRAW (GLOVE) ×1 IMPLANT
GOWN STRL REUS W/TWL LRG LVL3 (GOWN DISPOSABLE) ×2 IMPLANT
KIT ABG SYR 3ML LUER SLIP (SYRINGE) ×1 IMPLANT
NDL HYPO 25X5/8 SAFETYGLIDE (NEEDLE) ×1 IMPLANT
NEEDLE HYPO 25X5/8 SAFETYGLIDE (NEEDLE) ×1 IMPLANT
NS IRRIG 1000ML POUR BTL (IV SOLUTION) ×1 IMPLANT
PACK C SECTION WH (CUSTOM PROCEDURE TRAY) ×1 IMPLANT
PAD ABD DERMACEA PRESS 5X9 (GAUZE/BANDAGES/DRESSINGS) IMPLANT
PAD OB MATERNITY 4.3X12.25 (PERSONAL CARE ITEMS) ×1 IMPLANT
RTRCTR C-SECT PINK 25CM LRG (MISCELLANEOUS) ×1 IMPLANT
SUT MNCRL 0 VIOLET CTX 36 (SUTURE) ×2 IMPLANT
SUT PLAIN 0 NONE (SUTURE) IMPLANT
SUT PLAIN 2 0 (SUTURE)
SUT PLAIN 2 0 XLH (SUTURE) IMPLANT
SUT PLAIN ABS 2-0 CT1 27XMFL (SUTURE) IMPLANT
SUT VIC AB 0 CTX 36 (SUTURE) ×1
SUT VIC AB 0 CTX36XBRD ANBCTRL (SUTURE) ×1 IMPLANT
SUT VIC AB 2-0 CT1 27 (SUTURE)
SUT VIC AB 2-0 CT1 TAPERPNT 27 (SUTURE) IMPLANT
SUT VIC AB 4-0 KS 27 (SUTURE) ×1 IMPLANT
TOWEL OR 17X24 6PK STRL BLUE (TOWEL DISPOSABLE) ×1 IMPLANT
TRAY FOLEY W/BAG SLVR 14FR LF (SET/KITS/TRAYS/PACK) IMPLANT
WATER STERILE IRR 1000ML POUR (IV SOLUTION) ×1 IMPLANT

## 2023-02-13 NOTE — Anesthesia Procedure Notes (Signed)
Spinal  Patient location during procedure: OR Start time: 02/13/2023 9:36 AM End time: 02/13/2023 9:39 AM Reason for block: surgical anesthesia Staffing Performed: anesthesiologist  Anesthesiologist: Collene Schlichter, MD Performed by: Collene Schlichter, MD Authorized by: Collene Schlichter, MD   Preanesthetic Checklist Completed: patient identified, IV checked, risks and benefits discussed, surgical consent, monitors and equipment checked, pre-op evaluation and timeout performed Spinal Block Patient position: sitting Prep: DuraPrep and site prepped and draped Patient monitoring: continuous pulse ox and blood pressure Approach: midline Location: L3-4 Injection technique: single-shot Needle Needle type: Pencan  Needle gauge: 24 G Assessment Sensory level: T6 Events: CSF return Additional Notes Functioning IV was confirmed and monitors were applied. Sterile prep and drape, including hand hygiene, mask and sterile gloves were used. The patient was positioned and the spine was prepped. The skin was anesthetized with lidocaine.  Free flow of clear CSF was obtained prior to injecting local anesthetic into the CSF.  The spinal needle aspirated freely following injection.  The needle was carefully withdrawn.  The patient tolerated the procedure well. Consent was obtained prior to procedure with all questions answered and concerns addressed. Risks including but not limited to bleeding, infection, nerve damage, paralysis, failed block, inadequate analgesia, allergic reaction, high spinal, itching and headache were discussed and the patient wished to proceed.   Arrie Aran, MD

## 2023-02-13 NOTE — Progress Notes (Signed)
RN spoke with pt via Arabic Interpreter: Michele Gomez [phone] about the medication "Lovenox" ordered by her provider to explain this medication is derived from pork products.  RN explained this medication is given to post-surgery patients to prevent blood clots.  Pt states that she is okay with receiving medication if provider feels it is medically necessary.  RN also obtained pt meal order at this time and called to place patient's order.

## 2023-02-13 NOTE — Progress Notes (Signed)
Called to bedside shortly after completion of case due to some increased bleeding. Prior to my arrival fundus had been boggy but firmed up with massage and no bleeding was occurring on my evaluation and fundus remained firm. I directed one dose of methergine to be given with plan to place [place] device if ongoing.  Since then has had slightly trickling again secondary to poor uterine tone but do not feel it sufficiently warrants a Jada device at this time. Will give miso 800 mcg PO for more long acting uterotonic and start patient on a methergine series as well.  Venora Maples, MD/MPH Attending Family Medicine Physician, [facility] for [facility], [facility]

## 2023-02-13 NOTE — Lactation Note (Signed)
This note was copied from a baby's chart. Lactation Consultation Note  Patient Name: Michele Gomez ZOXWR'U Date: 02/13/2023 Age:38 hours  Attempted to see mom. Baby fixing to get a bath. Mom stated baby is eating every hour for 15 to 30 minutes. LC will come back to see mom later.   Maternal Data    Feeding    LATCH Score                    Lactation Tools Discussed/Used    Interventions    Discharge    Consult Status      Charyl Dancer 02/13/2023, 10:31 PM

## 2023-02-13 NOTE — Lactation Note (Signed)
This note was copied from a baby's chart. Lactation Consultation Note  Patient Name: Boy Addasyn Helsel ZOXWR'U Date: 02/13/2023 Age:38 hours  LC attempted to see mom again. Mom stated she wasn't good right now she is itching really bad.  LC notified nurse of mom's itching.   Maternal Data    Feeding    LATCH Score                    Lactation Tools Discussed/Used    Interventions    Discharge    Consult Status      Charyl Dancer 02/13/2023, 11:09 PM

## 2023-02-13 NOTE — Progress Notes (Signed)
RN utilized Clinical research associate, Kathrynn Ducking, for admission. This interpreter stated that she may be contacted outside of her normal hours at [phone] to assist with this family's specific needs for a [place] dialect.

## 2023-02-13 NOTE — Transfer of Care (Signed)
Immediate Anesthesia Transfer of Care Note  Patient: Michele Gomez  Procedure(s) Performed: CESAREAN SECTION  Patient Location: PACU  Anesthesia Type:Spinal  Level of Consciousness: awake, alert , and oriented  Airway & Oxygen Therapy: Patient Spontanous Breathing  Post-op Assessment: Report given to RN and Post -op Vital signs reviewed and stable  Post vital signs: Reviewed and stable  Last Vitals:  Vitals Value Taken Time  BP    Temp    Pulse 77 02/13/23 1058  Resp 17 02/13/23 1058  SpO2 99 % 02/13/23 1058    Last Pain:  Vitals:   02/13/23 0803  TempSrc: Oral         Complications: No notable events documented.

## 2023-02-13 NOTE — Op Note (Signed)
Operative Note   Patient: Michele Gomez  Date of Procedure: 02/13/2023  Procedure: Repeat Low Transverse Cesarean   Indications: previous uterine incision: low transverse x3  Pre-operative Diagnosis: Previous.   Post-operative Diagnosis: Same, uterine window  TOLAC Candidate: No  Surgeon: Surgeon(s) and Role:    * Warden Fillers, MD - Primary    * Crissie Reese, Mary Sella, MD - Assisting    * Ndulue, Nadene Rubins, MD - Fellow   An experienced assistant was required given the standard of surgical care given the complexity of the case.  This assistant was needed for exposure, dissection, suctioning, retraction, instrument exchange, assisting with delivery with administration of fundal pressure, and for overall help during the procedure.   Anesthesia: spinal  Anesthesiologist: Collene Schlichter, MD   Antibiotics: Cefazolin   Estimated Blood Loss: 650 ml   Total IV Fluids: 600 ml  Urine Output:  100 cc OF clear urine  Specimens: none   Complications:  uterine window noted upon entry into the abdominal cavity    Indications: Michele Gomez is a 37 y.o. [phone] with an IUP [redacted]w[redacted]d presenting for scheduled cesarean secondary to the indications listed above. Pre-operative counseling notable for discussion of tubal ligation which patient declined.   The risks of cesarean section discussed with the patient included but were not limited to: bleeding which may require transfusion or reoperation; infection which may require antibiotics; injury to bowel, bladder, ureters or other surrounding organs; injury to the fetus; need for additional procedures including hysterectomy in the event of a life-threatening hemorrhage; placental abnormalities with subsequent pregnancies, incisional problems, thromboembolic phenomenon and other postoperative/anesthesia complications. The patient concurred with the proposed plan, giving informed written consent for the procedure.  Patient has been NPO since last night she will remain NPO for procedure. Anesthesia and OR aware. Preoperative prophylactic antibiotics and SCDs ordered on call to the OR.   Findings: Viable infant in cephalic presentation, no nuchal cord present. Apgars 8 , 8 , 9 . Weight 3510 g . Clear amniotic fluid. Normal placenta, three vessel cord. Uterus with large window measuring approximately 6x4 cm in lower uterine segment and fetal hair visible through it, Normal bilateral fallopian tubes, Normal bilateral ovaries. Moderate adhesive disease, mostly in the anterior abdominal wall and bladder tacked up somewhat on the anterior uterine wall .  Procedure Details: A Time Out was held and the above information confirmed. The patient received intravenous antibiotics and had sequential compression devices applied to her lower extremities preoperatively. The patient was taken back to the operative suite where spinal anesthesia was administered. After induction of anesthesia, the patient was draped and prepped in the usual sterile manner and placed in a dorsal supine position with a leftward tilt. A low transverse skin incision was made with scalpel and carried down through the prior incision and subcutaneous tissue to the fascia. Fascial incision was made and extended transversely. The fascia was separated from the underlying rectus tissue superiorly and inferiorly. There was a moderate amount of adhesive disease noted in the anterior abdominal wall. The rectus muscles were separated in the midline bluntly and the peritoneum was entered bluntly. An Alexis retractor was placed to aid in visualization of the uterus. At this point a large uterine window measuring around 6x4 cm was encountered with fetal hair clearly visible through it, additionally the bladder was adhesed to the lower half of this window. A low transverse uterine incision was made through the superior portion of the window  taking care to avoid the bladder. The  infant was successfully delivered from cephalic presentation, the umbilical cord was clamped after 1 minute. Cord ph was not sent, and cord blood was obtained for evaluation. The placenta was removed Intact and appeared normal. The uterine incision was closed with a single layer running unlocked suture of 0-Monocryl. Overall, excellent hemostasis was noted. The abdomen and the pelvis were cleared of all clot and debris and the Jon Gills was removed. Hemostasis was confirmed on all surfaces.  The peritoneum was reapproximated using 2-0 vicryl . The fascia was then closed using 0 Vicryl in a running fashion. The subcutaneous layer was not reapproximated. The skin was closed with a 4-0 vicryl subcuticular stitch. The patient tolerated the procedure well. Sponge, lap, instrument and needle counts were correct x 2. She was taken to the recovery room in stable condition.  Disposition:  PACU, hemodynamically stable. In addition after the surgery I discussed the finding of the large uterine window and implications for future pregnancies. We discussed that I would not recommend another pregnancy given significant risk for complications and she has now had four. We also discussed that if she does choose to get pregnant she will need to be delivered at 37 weeks at the latest.      Signed: Venora Maples, MD/MPH Attending Family Medicine Physician, [facility] for [facility], [facility]

## 2023-02-13 NOTE — Anesthesia Postprocedure Evaluation (Signed)
Anesthesia Post Note  Patient: Michele Gomez  Procedure(s) Performed: CESAREAN SECTION     Patient location during evaluation: PACU Anesthesia Type: Spinal Level of consciousness: awake, awake and alert and oriented Pain management: pain level controlled Vital Signs Assessment: post-procedure vital signs reviewed and stable Respiratory status: spontaneous breathing, nonlabored ventilation and respiratory function stable Cardiovascular status: blood pressure returned to baseline and stable Postop Assessment: no headache, no backache, spinal receding and no apparent nausea or vomiting Anesthetic complications: no   No notable events documented.  Last Vitals:  Vitals:   02/13/23 1530 02/13/23 1640  BP: 106/69 107/77  Pulse: 83 86  Resp: 16 16  Temp: 37.1 C 36.9 C  SpO2: 98% 100%    Last Pain:  Vitals:   02/13/23 1640  TempSrc: Oral  PainSc:    Pain Goal: Patients Stated Pain Goal: 5 (02/13/23 1058)                 Collene Schlichter

## 2023-02-13 NOTE — Discharge Summary (Signed)
Postpartum Discharge Summary  Date of Service updated***     Patient Name: Michele Gomez DOB: Feb 14, 1986 MRN: [id number]  Date of admission: 02/13/2023 Delivery date:02/13/2023  Delivering provider: Venora Maples  Date of discharge: 02/13/2023  Admitting diagnosis: History of cesarean delivery [Z98.891] Intrauterine pregnancy: [redacted]w[redacted]d     Secondary diagnosis:  Principal Problem:   Status post repeat low transverse cesarean section Active Problems:   Supervision of high risk pregnancy, antepartum   AMA (advanced maternal age) multigravida 35+   Language barrier   Abnormal Pap smear of cervix   Group B Streptococcus carrier, +RV culture, currently pregnant   History of cesarean delivery   Dehiscence of old uterine scar with extension before onset of labor, delivered  Additional problems: ***    Discharge diagnosis: {DX.:23714}                                              Post partum procedures:{Postpartum procedures:23558} Augmentation: N/A Complications: None  Hospital course: Sceduled C/S   37 y.o. yo [id number] at [redacted]w[redacted]d was admitted to the hospital 02/13/2023 for scheduled cesarean section with the following indication:Elective Repeat.Delivery details are as follows:  Membrane Rupture Time/Date: 10:03 AM ,02/13/2023   Delivery Method:C-Section, Low Transverse  Details of operation can be found in separate operative note.  Patient had a postpartum course complicated by***.  She is ambulating, tolerating a regular diet, passing flatus, and urinating well. Patient is discharged home in stable condition on  02/13/23        Newborn Data: Birth date:02/13/2023  Birth time:10:04 AM  Gender:Female  Living status:Living  Apgars:8 ,8  Weight:3510 g     Magnesium Sulfate received: No BMZ received: No Rhophylac:N/A MMR:N/A T-DaP:Given prenatally Flu: {JYN:82956} Transfusion:{Transfusion received:30440034}  Physical exam  Vitals:   02/13/23 0803 02/13/23 1058   BP: (!) 128/96 122/87  Pulse: 80 77  Resp: 20 17  Temp: 98.2 F (36.8 C) 98.4 F (36.9 C)  TempSrc: Oral   SpO2: 95% 99%  Weight: 79.8 kg   Height: 5\' 6"  (1.676 m)    General: {Exam; general:21111117} Lochia: {Desc; appropriate/inappropriate:30686::"appropriate"} Uterine Fundus: {Desc; firm/soft:30687} Incision: {Exam; incision:21111123} DVT Evaluation: {Exam; dvt:2111122} Labs: Lab Results  Component Value Date   WBC 5.7 02/10/2023   HGB 12.4 02/10/2023   HCT 36.0 02/10/2023   MCV 90.2 02/10/2023   PLT 183 02/10/2023      Latest Ref Rng & Units 02/10/2023    9:22 AM  CMP  Glucose 70 - 99 mg/dL 213   BUN 6 - 20 mg/dL 5   Creatinine 0.86 - 5.78 mg/dL 4.69   Sodium 629 - 528 mmol/L 137   Potassium 3.5 - 5.1 mmol/L 3.4   Chloride 98 - 111 mmol/L 107   CO2 22 - 32 mmol/L 20   Calcium 8.9 - 10.3 mg/dL 9.1   Total Protein 6.5 - 8.1 g/dL 5.7   Total Bilirubin 0.3 - 1.2 mg/dL 0.4   Alkaline Phos 38 - 126 U/L 127   AST 15 - 41 U/L 29   ALT 0 - 44 U/L 19    Edinburgh Score:     No data to display           After visit meds:  Allergies as of 02/13/2023       Reactions   Other  Egg Plants   Strawberry Flavor      Med Rec must be completed prior to using this [facility]***        Discharge home in stable condition Infant Feeding: {Baby feeding:23562} Infant Disposition:{CHL IP OB HOME WITH BJYNWG:95621} Discharge instruction: per After Visit Summary and Postpartum booklet. Activity: Advance as tolerated. Pelvic rest for 6 weeks.  Diet: {OB HYQM:57846962} Future Appointments:No future appointments. Follow up Visit:  The following message was sent to [facility] by Gwenyth Allegra, MD   Please schedule this patient for a In person postpartum visit in 6 weeks with the following provider: Any provider. Additional Postpartum F/U:Incision check 1 week  High risk pregnancy complicated by:  3 previous C-sections Delivery mode:  C-Section, Low Transverse   Anticipated Birth Control:  pp nexplanon planned.   02/13/2023 [name], MD

## 2023-02-13 NOTE — H&P (Signed)
LABOR AND DELIVERY ADMISSION HISTORY AND PHYSICAL NOTE  Michele Gomez is a 37 y.o. female [phone] with IUP at [redacted]w[redacted]d presenting for scheduled elective repeat c-section. She has had 3 previous C-sections, all in her home [place].   Patient reports the fetal movement as active. Patient reports uterine contraction  activity as none. Patient reports  vaginal bleeding as none. Patient describes fluid per vagina as None.   Patient denies headache, vision changes, chest pain, shortness of breath, right upper quadrant pain, or LE edema.  She plans on breast feeding feeding. Her contraception plan is:  nexplanon .  Prenatal History/Complications: [facility]  Sono:  @[redacted]w[redacted]d , CWD, normal anatomy, cephalic presentation, anterior placenta, 72%ile, EFW 2131g  Pregnancy complications:  Patient Active Problem List   Diagnosis Date Noted   Group B Streptococcus carrier, +RV culture, currently pregnant 02/07/2023   Language barrier 08/30/2022   Abnormal Pap smear of cervix 08/30/2022   Supervision of high risk pregnancy, antepartum 08/04/2022   History of cesarean section 08/04/2022   AMA (advanced maternal age) multigravida 35+ 08/04/2022    Past Medical History: Past Medical History:  Diagnosis Date   Medical history non-contributory     Past Surgical History: Past Surgical History:  Procedure Laterality Date   CESAREAN SECTION     3x    Obstetrical History: OB History     Gravida  4   Para  3   Term  3   Preterm      AB      Living  3      SAB      IAB      Ectopic      Multiple      Live Births  3           Social History: Social History   Socioeconomic History   Marital status: Married    Spouse name: Not on file   Number of children: Not on file   Years of education: Not on file   Highest education level: Not on file  Occupational History   Not on file  Tobacco Use   Smoking status: Never    Passive exposure: Never    Smokeless tobacco: Never  Vaping Use   Vaping Use: Never used  Substance and Sexual Activity   Alcohol use: Never   Drug use: Never   Sexual activity: Yes  Other Topics Concern   Not on file  Social History Narrative   Not on file   Social Determinants of Health   Financial Resource Strain: Not on file  Food Insecurity: No Food Insecurity (02/13/2023)   Hunger Vital Sign    Worried About Running Out of Food in the Last Year: Never true    Ran Out of Food in the Last Year: Never true  Transportation Needs: No Transportation Needs (02/13/2023)   PRAPARE - Administrator, Civil Service (Medical): No    Lack of Transportation (Non-Medical): No  Physical Activity: Not on file  Stress: Not on file  Social Connections: Not on file    Family History: Family History  Problem Relation Age of Onset   Asthma Neg Hx    Cancer Neg Hx    Diabetes Neg Hx    Heart disease Neg Hx    Hypertension Neg Hx     Allergies: Allergies  Allergen Reactions   Other     Egg Plants   Strawberry Flavor  Medications Prior to Admission  Medication Sig Dispense Refill Last Dose   aspirin EC 81 MG tablet Take 1 tablet (81 mg total) by mouth daily. Take after 12 weeks for prevention of preeclampsia later in pregnancy 300 tablet 2 02/06/2023   omeprazole (PRILOSEC) 20 MG capsule Take 1 capsule (20 mg total) by mouth 2 (two) times daily before a meal. 60 capsule 3    Prenatal 27-1 MG TABS Take 1 tablet by mouth daily. 30 tablet 11      Review of Systems  All systems reviewed and negative except as stated in HPI  Physical Exam BP (!) 128/96   Pulse 80   Temp 98.2 F (36.8 C) (Oral)   Resp 20   Ht 5\' 6"  (1.676 m)   Wt 79.8 kg   LMP 05/16/2022 (Exact Date)   SpO2 95%   BMI 28.41 kg/m   Physical Exam Constitutional:      General: She is not in acute distress.    Appearance: Normal appearance. She is not toxic-appearing.  HENT:     Head: Normocephalic and atraumatic.      Nose: Nose normal.     Mouth/Throat:     Mouth: Mucous membranes are moist.  Eyes:     Extraocular Movements: Extraocular movements intact.     Conjunctiva/sclera: Conjunctivae normal.  Cardiovascular:     Rate and Rhythm: Normal rate.     Pulses: Normal pulses.  Pulmonary:     Effort: Pulmonary effort is normal. No respiratory distress.  Abdominal:     Tenderness: There is no abdominal tenderness.     Comments: Gravid, well healed pfannenstiel incision scar noted  Skin:    General: Skin is warm and dry.     Capillary Refill: Capillary refill takes less than 2 seconds.  Neurological:     General: No focal deficit present.     Mental Status: She is alert.  Psychiatric:        Mood and Affect: Mood normal.        Behavior: Behavior normal.     FHR: 158  Prenatal labs: ABO, Rh: --/--/B POS (05/17 0454) Antibody: NEG (05/17 0922) Rubella: 19.70 (11/09 0919) RPR: NON REACTIVE (05/17 0922)  HBsAg: Negative (11/09 0919)  HIV: Non Reactive (02/26 0839)  GC/Chlamydia:  Neisseria Gonorrhea  Date Value Ref Range Status  02/02/2023 Negative  Final   Chlamydia  Date Value Ref Range Status  02/02/2023 Negative  Final   GBS: Positive/-- (05/09 1600)  Prenatal Transfer Tool  Maternal Diabetes: No Genetic Screening: Normal Maternal Ultrasounds/Referrals: Normal Fetal Ultrasounds or other Referrals:  None Maternal Substance Abuse:  No Significant Maternal Medications:  None Significant Maternal Lab Results: Group B Strep positive  No results found for this or any previous visit (from the past 24 hour(s)).  Assessment: Michele Gomez is a 37 y.o. [phone] at [redacted]w[redacted]d here for scheduled repeat cesarean section.  #Repeat Low Transverse Cesarean  The risks of cesarean section discussed with the patient included but were not limited to: bleeding which may require transfusion or reoperation; infection which may require antibiotics; injury to bowel, bladder, ureters or  other surrounding organs; injury to the fetus; need for additional procedures including hysterectomy in the event of a life-threatening hemorrhage; placental abnormalities with subsequent pregnancies, incisional problems, thromboembolic phenomenon and other postoperative/anesthesia complications. The patient concurred with the proposed plan, giving informed written consent for the procedure. Patient has been NPO since last night she will remain NPO for procedure.  Anesthesia and OR aware. Preoperative prophylactic antibiotics and SCDs ordered on call to the OR.   #Anesthesia: spinal #FWB: FHR 158bpm #GBS/ID: Positive #MOF: breast feeding #MOC:  nexplanon inpatient #Circ: Yes   Sheppard Evens MD MPH OB Fellow, Faculty Practice [facility], Center for [facility] 02/13/2023

## 2023-02-14 LAB — BPAM RBC
Blood Product Expiration Date: 202406042359
Blood Product Expiration Date: 202406042359
Blood Product Expiration Date: 202406042359
ISSUE DATE / TIME: 202405201013
ISSUE DATE / TIME: 202405201013
Unit Type and Rh: 7300
Unit Type and Rh: 7300
Unit Type and Rh: 7300

## 2023-02-14 LAB — TYPE AND SCREEN
ABO/RH(D): B POS
Antibody Screen: NEGATIVE
Unit division: 0
Unit division: 0
Unit division: 0
Unit division: 0

## 2023-02-14 LAB — CBC
HCT: 26 % — ABNORMAL LOW (ref 36.0–46.0)
Hemoglobin: 9.1 g/dL — ABNORMAL LOW (ref 12.0–15.0)
MCH: 31.9 pg (ref 26.0–34.0)
MCHC: 35 g/dL (ref 30.0–36.0)
MCV: 91.2 fL (ref 80.0–100.0)
Platelets: 148 10*3/uL — ABNORMAL LOW (ref 150–400)
RBC: 2.85 MIL/uL — ABNORMAL LOW (ref 3.87–5.11)
RDW: 12.4 % (ref 11.5–15.5)
WBC: 11 10*3/uL — ABNORMAL HIGH (ref 4.0–10.5)
nRBC: 0 % (ref 0.0–0.2)

## 2023-02-14 MED ORDER — IBUPROFEN 600 MG PO TABS
600.0000 mg | ORAL_TABLET | Freq: Four times a day (QID) | ORAL | Status: DC
  Administered 2023-02-14 – 2023-02-15 (×6): 600 mg via ORAL
  Filled 2023-02-14 (×6): qty 1

## 2023-02-14 NOTE — Social Work (Signed)
CSW received consult due to score 11 on Edinburgh Depression Screen. CSW mw with MOB to complete an assessment and offer support. CSW entered the room, observed MOB in bed, the infant in the bassinet and Arabic interpreter Kathrynn Ducking at bedside. CSW introduced self, CSW role and reason for visit, MOB was agreeable to visit. CSW inquired about how MOB was feeling, MOB reported good. CSW inquired about MOB past 7 due to MOB's EPDS score, MOB reported feeling worried about the delivery. MOB reported feeling much better now. CSW inquired about any MH dx, MOB denied any, CSW assessed for safety, MOB denied any SI, HI or DV. CSW provided education regarding Baby Blues vs PMADs and provided MOB with resources for mental health follow up.  CSW encouraged MOB to evaluate her mental health throughout the postpartum period with the use of the New Mom Checklist developed by Postpartum Progress as well as the [place] Postnatal Depression Scale and notify a medical professional if symptoms arise.  MOB identified her husband as her support.   CSW provided review of Sudden Infant Death Syndrome (SIDS) precautions.  MOB reported she has all necessary items for the infant including a bassinet and a car seat.  CSW identifies no further need for intervention and no barriers to discharge at this time.  Wende Neighbors, LCSWA Clinical Social Worker [phone]

## 2023-02-14 NOTE — Progress Notes (Signed)
RN called Michele Gomez's Interpreter Services to request scheduling in person Arabic Interpreter per patient request to assist her with completing paperwork and possible discharge tomorrow 5/22.

## 2023-02-14 NOTE — Progress Notes (Signed)
POSTPARTUM PROGRESS NOTE  POD #1  Subjective:  Michele Gomez is a 37 y.o. [phone] s/p rLTCS at [redacted]w[redacted]d.  She reports she doing well. No acute events overnight. She reports she is doing well. She is up and out of bed. She denies any problems with ambulating, voiding or po intake. Denies nausea or vomiting. She has passed flatus. Pain is well controlled.  Lochia is minimal.  Objective: Blood pressure 103/70, pulse 75, temperature 97.6 F (36.4 C), temperature source Oral, resp. rate 16, height 5\' 6"  (1.676 m), weight 79.8 kg, last menstrual period 05/16/2022, SpO2 100 %, unknown if currently breastfeeding.  Physical Exam:  General: alert, cooperative and no distress Chest: no respiratory distress Heart:regular rate, distal pulses intact Abdomen: soft, nontender,  Uterine Fundus: firm, appropriately tender DVT Evaluation: No calf swelling or tenderness Extremities: no edema Skin: warm, dry; incision clean/dry/intact w/ honeycomb and pressure dressing in place  Recent Labs    02/14/23 0435  HGB 9.1*  HCT 26.0*    Assessment/Plan: Michele Gomez is a 37 y.o. [id number] s/p elective rLTCS at [redacted]w[redacted]d.  POD#1 - Doing welll; pain is well controlled. H/H appropriate  Routine postpartum care  OOB, ambulated  Lovenox for VTE prophylaxis Mild Anemia: asymptomatic  Start po ferrous sulfate every other day  Contraception: nexplanon inpatient Feeding: breast  Dispo: Plan for discharge in 1-2 days.   LOS: 1 day   Sheppard Evens MD MPH OB Fellow, Faculty Practice [facility], Center for [facility] 02/14/2023

## 2023-02-14 NOTE — Lactation Note (Signed)
This note was copied from a baby's chart. Lactation Consultation Note  Patient Name: Michele Gomez [id number] Date: 02/14/2023 Age:38 hours Reason for consult: Initial assessment  Arabic interpreter used via video.   P5, Assisted with latching with ease.  Encouraged mother to offer breast before formula.  Mother states she is concerned about her supply.  She would like to supplement with formula.  Provided volume guidelines of 5-7 ml after breastfeeding.  Feed on demand with cues.  Goal 8-12+ times per day after first 24 hrs.  Place baby STS if not cueing.  Lactation information sheet given.    Maternal Data Has patient been taught Hand Expression?: Yes Does the patient have breastfeeding experience prior to this delivery?: Yes How long did the patient breastfeed?: 18 mos with each child  Feeding Mother's Current Feeding Choice: Breast Milk and Formula  LATCH Score Latch: Grasps breast easily, tongue down, lips flanged, rhythmical sucking.  Audible Swallowing: A few with stimulation  Type of Nipple: Everted at rest and after stimulation  Comfort (Breast/Nipple): Soft / non-tender  Hold (Positioning): Assistance needed to correctly position infant at breast and maintain latch.  LATCH Score: 8   Interventions Interventions: Breast feeding basics reviewed;Assisted with latch;Education  Discharge Pump:  (Has been ordered)  Consult Status Consult Status: Follow-up Date: 02/15/23 Follow-up type: In-patient    [name] [facility] 02/14/2023, 8:35 AM

## 2023-02-15 ENCOUNTER — Encounter (HOSPITAL_COMMUNITY): Payer: Self-pay | Admitting: Family Medicine

## 2023-02-15 DIAGNOSIS — Z975 Presence of (intrauterine) contraceptive device: Secondary | ICD-10-CM

## 2023-02-15 DIAGNOSIS — Z30017 Encounter for initial prescription of implantable subdermal contraceptive: Secondary | ICD-10-CM

## 2023-02-15 DIAGNOSIS — D649 Anemia, unspecified: Secondary | ICD-10-CM

## 2023-02-15 HISTORY — DX: Anemia, unspecified: D64.9

## 2023-02-15 HISTORY — PX: NEXPLANON TRAY: NUR84248

## 2023-02-15 MED ORDER — OXYCODONE HCL 5 MG PO TABS: 5.0000 mg | ORAL_TABLET | Freq: Four times a day (QID) | ORAL | 0 refills | Status: DC | PRN

## 2023-02-15 MED ORDER — SENNOSIDES-DOCUSATE SODIUM 8.6-50 MG PO TABS: 2.0000 | ORAL_TABLET | Freq: Every day | ORAL | 0 refills | Status: DC | PRN

## 2023-02-15 MED ORDER — FERROUS SULFATE 325 (65 FE) MG PO TABS: 325.0000 mg | ORAL_TABLET | ORAL | 0 refills | Status: AC

## 2023-02-15 MED ORDER — LIDOCAINE HCL 1 % IJ SOLN
0.0000 mL | Freq: Once | INTRAMUSCULAR | Status: AC | PRN
  Administered 2023-02-15: 20 mL via INTRADERMAL
  Filled 2023-02-15: qty 20

## 2023-02-15 MED ORDER — ETONOGESTREL 68 MG ~~LOC~~ IMPL
68.0000 mg | DRUG_IMPLANT | Freq: Once | SUBCUTANEOUS | Status: AC
  Administered 2023-02-15: 68 mg via SUBCUTANEOUS
  Filled 2023-02-15: qty 1

## 2023-02-15 MED ORDER — IBUPROFEN 600 MG PO TABS: 600.0000 mg | ORAL_TABLET | Freq: Three times a day (TID) | ORAL | 0 refills | Status: DC | PRN

## 2023-02-15 NOTE — Lactation Note (Signed)
This note was copied from a baby's chart. Lactation Consultation Note  Patient Name: Michele Gomez [id number] Date: 02/15/2023 Age:38 hours - Arabic interpreter - Kathrynn Ducking - present  Reason for consult: Follow-up assessment;Infant weight loss;Term (2 % weight loss) D/C for dyad has bee written.  As LC entered the room, noted the baby to be latched shallow and dimpling. LC offered to assist and left baby latched, eased down chin and increased depth. Increased swallows noted and per mom comfortable.  LC reviewed BF D/C teaching and the [facility] resources.  LC provided a hand pump, checked the flange #21 F was good fit and #24 F provided for when the milk comes in.  Per mom per interpreter mom did not have any BF questions.     Maternal Data Has patient been taught Hand Expression?: Yes  Feeding Mother's Current Feeding Choice: Breast Milk and Formula  LATCH Score Latch: Grasps breast easily, tongue down, lips flanged, rhythmical sucking.  Audible Swallowing: Spontaneous and intermittent  Type of Nipple: Everted at rest and after stimulation  Comfort (Breast/Nipple): Soft / non-tender  Hold (Positioning): Assistance needed to correctly position infant at breast and maintain latch.  LATCH Score: 9   Lactation Tools Discussed/Used  Hand pump, #24 F #21 F   Interventions Interventions: Breast feeding basics reviewed;Hand pump;Education  Discharge Discharge Education: Engorgement and breast care;Warning signs for feeding baby Pump: Manual  Consult Status Consult Status: Complete Date: 02/15/23    Kathrin Greathouse 02/15/2023, 2:55 PM

## 2023-02-15 NOTE — Procedures (Signed)
    Nexplanon Insertion Procedure Patient identified, informed consent performed, consent signed.   Patient does understand that irregular bleeding is a very common side effect of this medication. She was advised to have backup contraception for one week after placement. Pregnancy test in clinic today was negative.  Appropriate time out taken.    Patient's left arm was prepped and draped in the usual sterile fashion. The ruler used to measure and mark insertion area.  Patient was prepped with alcohol swab and then injected with 3 ml of 1% lidocaine.  She was prepped with betadine, Nexplanon removed from packaging,  Device confirmed in needle, then inserted full length of needle and withdrawn per handbook instructions. Nexplanon was able to palpated in the patient's arm; patient palpated the insert herself. There was minimal blood loss.  Patient insertion site covered with guaze and a pressure bandage to reduce any bruising.    The patient tolerated the procedure well and was given post procedure instructions.    Sheppard Evens MD MPH OB Fellow, Faculty Practice [facility], Center for [facility] 02/15/2023

## 2023-02-16 ENCOUNTER — Telehealth (HOSPITAL_COMMUNITY): Payer: Self-pay | Admitting: *Deleted

## 2023-02-16 DIAGNOSIS — Z1331 Encounter for screening for depression: Secondary | ICD-10-CM

## 2023-02-16 NOTE — Telephone Encounter (Signed)
IBH order placed due to in hospital EPDS score being 11.

## 2023-02-21 ENCOUNTER — Ambulatory Visit: Payer: Medicaid Other

## 2023-02-21 ENCOUNTER — Telehealth (HOSPITAL_COMMUNITY): Payer: Self-pay | Admitting: *Deleted

## 2023-02-21 NOTE — Telephone Encounter (Signed)
Mom reports feeling good. Incision healing well per mom. No concerns regarding herself at this time. EPDS=4 (hospital score=11) Mom reports baby is well. Feeding, peeing, and pooping without difficulty. Reviewed safe sleep. Mom has no concerns about baby at present.  Duffy Rhody, RN 02-21-2023 at 12:12pm

## 2023-02-28 ENCOUNTER — Ambulatory Visit (INDEPENDENT_AMBULATORY_CARE_PROVIDER_SITE_OTHER): Payer: Medicaid Other

## 2023-02-28 ENCOUNTER — Other Ambulatory Visit: Payer: Self-pay

## 2023-02-28 ENCOUNTER — Other Ambulatory Visit (HOSPITAL_COMMUNITY)
Admission: RE | Admit: 2023-02-28 | Discharge: 2023-02-28 | Disposition: A | Discharge status: Home / Self Care | Payer: Medicaid Other | Source: Ambulatory Visit | Attending: Family Medicine | Admitting: Family Medicine

## 2023-02-28 VITALS — BP 109/80 | HR 78 | Wt 146.5 lb

## 2023-02-28 DIAGNOSIS — N898 Other specified noninflammatory disorders of vagina: Secondary | ICD-10-CM

## 2023-02-28 MED ORDER — FLUCONAZOLE 150 MG PO TABS
150.0000 mg | ORAL_TABLET | Freq: Once | ORAL | 0 refills | Status: AC
Start: 2023-02-28 — End: 2023-02-28

## 2023-02-28 NOTE — Progress Notes (Signed)
Incision Check Visit  [facility] Michele Gomez is here for incision check following repeat c-section on 02/13/23. Incision is partially covered with Dermabond; areas visible are clean, dry, and intact. Pain is well controlled with ibuprofen and oxycodone. Reviewed good wound care and s/s of infection with patient. Reports vaginal itching; Diflucan sent per protocol and vaginal self swab collected. Also reports burning with urination. Unable to obtain clean urine specimen. Encouraged pt to return to office if burning does not improve or worsens.  [employer] virtual interpreter [place]ham ID [id number] interpreted this encounter. Patient to follow up at [facility] visit on 03/14/23.  Marjo Bicker, RN 02/28/2023  9:09 AM

## 2023-03-01 LAB — CERVICOVAGINAL ANCILLARY ONLY
Bacterial Vaginitis (gardnerella): NEGATIVE
Candida Glabrata: NEGATIVE
Candida Vaginitis: NEGATIVE
Comment: NEGATIVE
Comment: NEGATIVE
Comment: NEGATIVE
Comment: NEGATIVE
Trichomonas: NEGATIVE

## 2023-03-14 ENCOUNTER — Ambulatory Visit (INDEPENDENT_AMBULATORY_CARE_PROVIDER_SITE_OTHER): Payer: Medicaid Other | Admitting: Obstetrics and Gynecology

## 2023-03-14 ENCOUNTER — Other Ambulatory Visit: Payer: Self-pay

## 2023-03-14 DIAGNOSIS — N898 Other specified noninflammatory disorders of vagina: Secondary | ICD-10-CM | POA: Diagnosis not present

## 2023-03-14 DIAGNOSIS — Z98891 History of uterine scar from previous surgery: Secondary | ICD-10-CM | POA: Diagnosis not present

## 2023-03-14 DIAGNOSIS — Z975 Presence of (intrauterine) contraceptive device: Secondary | ICD-10-CM

## 2023-03-14 LAB — POCT HEMOGLOBIN-HEMACUE: Hemoglobin: 11 g/dL — ABNORMAL LOW (ref 12.0–15.0)

## 2023-03-14 MED ORDER — FLUCONAZOLE 150 MG PO TABS
150.0000 mg | ORAL_TABLET | Freq: Once | ORAL | 0 refills | Status: AC
Start: 2023-03-14 — End: 2023-03-14

## 2023-03-14 NOTE — Progress Notes (Signed)
Post Partum Visit Note  Michele Gomez is a 37 y.o. [phone] female who presents for a postpartum visit. She is 4 week postpartum following a repeat cesarean section.  I have fully reviewed the prenatal and intrapartum course. The delivery was at 39 gestational weeks.  Anesthesia: epidural. Postpartum course has been good. Baby is doing well. Baby is feeding by breast. Bleeding no bleeding. Bowel function is normal. Bladder function is normal. Patient is not sexually active. Contraception method is vasectomy. Postpartum depression screening: negative.   Upstream - 03/14/23 0905       Pregnancy Intention Screening   Does the patient want to become pregnant in the next year? No    Does the patient's partner want to become pregnant in the next year? No    Would the patient like to discuss contraceptive options today? No      Contraception Wrap Up   Current Method Hormonal Implant            Complaint of dysuria and vaginal itching. Was given a Diflucan at nurse visit but has not taken it, was unsure how to take it.    The pregnancy intention screening data noted above was reviewed. Potential methods of contraception were discussed. The patient elected to proceed with No data recorded.   Edinburgh Postnatal Depression Scale - 03/14/23 0906       Edinburgh Postnatal Depression Scale:  In the Past 7 Days   I have been able to laugh and see the funny side of things. 0    I have looked forward with enjoyment to things. 0    I have blamed myself unnecessarily when things went wrong. 0    I have been anxious or worried for no good reason. 0    I have felt scared or panicky for no good reason. 0    Things have been getting on top of me. 0    I have been so unhappy that I have had difficulty sleeping. 0    I have been so unhappy that I have been crying. 0    The thought of harming myself has occurred to me. 0             Health Maintenance Due  Topic Date Due    COVID-19 Vaccine (1) Never done    The following portions of the patient's history were reviewed and updated as appropriate: allergies, current medications, past family history, past medical history, past social history, past surgical history, and problem list.  Review of Systems Pertinent items are noted in HPI.  Objective:  BP 117/79   Pulse 79   Wt 147 lb 6.4 oz (66.9 kg)   LMP 05/16/2022 (Exact Date)   BMI 23.79 kg/m    General:  alert and cooperative   Breasts:  not indicated  Lungs: Normal effot  Heart:  regular  Abdomen: Soft, non tender    Wound well approximated incision  GU exam:  not indicated       Assessment:  1. Postpartum examination following cesarean delivery Hemacue in office today 11.1 Incision healing well Discussed routine pap smear follow up   2. Status post repeat low transverse cesarean section  3. Nexplanon in place  4. Vaginal itching Encouraged to take Diflucan today, and take one in 3 days if not better Follow up if worsening symptoms - fluconazole (DIFLUCAN) 150 MG tablet; Take 1 tablet (150 mg total) by mouth once for 1 dose.  Dispense: 1  tablet; Refill: 0    Plan:   Essential components of care per ACOG recommendations:  1.  Mood and well being: Patient with negative depression screening today. Reviewed local resources for support.  - Patient tobacco use? No.   - hx of drug use? No.    2. Infant care and feeding:  -Patient currently breastmilk feeding? Yes. Discussed returning to work and pumping. Reviewed importance of draining breast regularly to support lactation.  -Social determinants of health (SDOH) reviewed in EPIC. No concerns  3. Sexuality, contraception and birth spacing - Patient does not want a pregnancy in the next year.  Desired family size is 5 children.  - Reviewed reproductive life planning. Reviewed contraceptive methods based on pt preferences and effectiveness.  Patient has nexplanon in place.  - Discussed  birth spacing of 18 months  4. Sleep and fatigue -Encouraged family/partner/community support of 4 hrs of uninterrupted sleep to help with mood and fatigue  5. Physical Recovery  - Discussed patients delivery and complications. She describes her labor as good. - Patient had a C-section repeat; no problems after deliver. Patient had a  no  laceration. Perineal healing reviewed. Patient expressed understanding - Patient has urinary incontinence? No. - Patient is safe to resume physical and sexual activity  6.  Health Maintenance - HM due items addressed Yes - Last pap smear  Diagnosis  Date Value Ref Range Status  05/26/2022 (A)     - Atypical squamous cells of undetermined significance (ASC-US)   Pap smear not done at today's visit.  -Breast Cancer screening indicated? No.   7. Chronic Disease/Pregnancy Condition follow up: Anemia (taking oral iron)  - PCP follow up  Albertine Grates, FNP Center for [employer], [facility]

## 2023-03-16 ENCOUNTER — Encounter: Payer: Self-pay | Admitting: General Practice

## 2023-05-18 ENCOUNTER — Other Ambulatory Visit (HOSPITAL_COMMUNITY): Payer: Self-pay
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
## 2023-07-18 ENCOUNTER — Ambulatory Visit: Payer: Self-pay | Admitting: *Deleted

## 2023-07-18 NOTE — Telephone Encounter (Signed)
Summary: Abdominal pain, leg and head pain   abdominal pain, leg pain, head pain.Marland Kitchen.  (interpreter needed per Case manager Roberto Scales)         Interpreter: [phone]   Reason for Disposition  Abdominal pain is a chronic symptom (recurrent or ongoing AND present > 4 weeks)  Answer Assessment - Initial Assessment Questions 1. LOCATION: "Where does it hurt?"      Lower quadrant abdominal pain- bilateral 2. RADIATION: "Does the pain shoot anywhere else?" (e.g., chest, back)     *No Answer* 3. ONSET: "When did the pain begin?" (e.g., minutes, hours or days ago)      After delivery of child- the pain was stronger- 02/13/23 4. SUDDEN: "Gradual or sudden onset?"     *No Answer* 5. PATTERN "Does the pain come and go, or is it constant?"    - If it comes and goes: "How long does it last?" "Do you have pain now?"     (Note: Comes and goes means the pain is intermittent. It goes away completely between bouts.)    - If constant: "Is it getting better, staying the same, or getting worse?"      (Note: Constant means the pain never goes away completely; most serious pain is constant and gets worse.)      Burning sensation after pain- followed by headache-comes and goes 6. SEVERITY: "How bad is the pain?"  (e.g., Scale 1-10; mild, moderate, or severe)    - MILD (1-3): Doesn't interfere with normal activities, abdomen soft and not tender to touch.     - MODERATE (4-7): Interferes with normal activities or awakens from sleep, abdomen tender to touch.     - SEVERE (8-10): Excruciating pain, doubled over, unable to do any normal activities.       7/10- moderate 7. RECURRENT SYMPTOM: "Have you ever had this type of stomach pain before?" If Yes, ask: "When was the last time?" and "What happened that time?"      No- ongoing- patient states PCP is aware 8. CAUSE: "What do you think is causing the stomach pain?"     Not sure 9. RELIEVING/AGGRAVATING FACTORS: "What makes it better or worse?" (e.g.,  antacids, bending or twisting motion, bowel movement)     Activity has helped a little 10. OTHER SYMPTOMS: "Do you have any other symptoms?" (e.g., back pain, diarrhea, fever, urination pain, vomiting)       Headache,feels feverish at night- some sweating, runny nose  Protocols used: Abdominal Pain - [facility]

## 2023-07-18 NOTE — Telephone Encounter (Signed)
  Chief Complaint: abdominal pain- bilateral lower quadrant pain- chronic Symptoms: chronic abdominal pain- moderate- especially worse since delivery of baby- 02/13/23, patient also complains of accompanying headache at times Frequency: chronic Pertinent Negatives: Patient denies back pain, diarrhea, fever, urination pain, vomiting  Disposition: [] ED /[] Urgent Care (no appt availability in office) / [x] Appointment(In office/virtual)/ []  [facility]/ [] Home Care/ [] Refused Recommended Disposition /[] [facility]/ []  Follow-up with PCP Additional Notes: Patient is calling to report she is having increased pain - bilateral sides. Patient states she has discussed this pain with provider- but does not have diagnosis. Patient also state sshe has headaches, feels feverish at night and has runny nose. Patient has been scheduled at first available appointment with provider- she has also been given the location of mobile unit tomorrow because she has to go in am hours. Patient advised I would send message to provider to see if there is a sooner appointment in am. Triage call was difficult due to interpreter and background noise.

## 2023-07-19 ENCOUNTER — Other Ambulatory Visit (HOSPITAL_COMMUNITY)
Admission: RE | Admit: 2023-07-19 | Discharge: 2023-07-19 | Disposition: A | Discharge status: Home / Self Care | Payer: Medicaid Other | Source: Ambulatory Visit | Attending: Family | Admitting: Family

## 2023-07-19 ENCOUNTER — Ambulatory Visit (INDEPENDENT_AMBULATORY_CARE_PROVIDER_SITE_OTHER): Payer: Medicaid Other | Admitting: Family

## 2023-07-19 ENCOUNTER — Encounter: Payer: Self-pay | Admitting: Family

## 2023-07-19 VITALS — BP 133/91 | HR 87 | Temp 97.8°F | Ht 65.0 in | Wt 151.2 lb

## 2023-07-19 DIAGNOSIS — R519 Headache, unspecified: Secondary | ICD-10-CM

## 2023-07-19 DIAGNOSIS — Z975 Presence of (intrauterine) contraceptive device: Secondary | ICD-10-CM | POA: Diagnosis not present

## 2023-07-19 DIAGNOSIS — Z114 Encounter for screening for human immunodeficiency virus [HIV]: Secondary | ICD-10-CM

## 2023-07-19 DIAGNOSIS — G8929 Other chronic pain: Secondary | ICD-10-CM | POA: Diagnosis present

## 2023-07-19 DIAGNOSIS — Z603 Acculturation difficulty: Secondary | ICD-10-CM

## 2023-07-19 DIAGNOSIS — Z758 Other problems related to medical facilities and other health care: Secondary | ICD-10-CM

## 2023-07-19 DIAGNOSIS — R109 Unspecified abdominal pain: Secondary | ICD-10-CM | POA: Diagnosis present

## 2023-07-19 NOTE — Progress Notes (Signed)
Patient states pain in abdomen.   States headaches sometimes.

## 2023-07-19 NOTE — Progress Notes (Signed)
Patient ID: Michele Gomez, female    DOB: [birth year]  MRN: [id number]  CC: Chronic Abdominal Pain  Subjective: Michele Gomez is a 37 y.o. female who presents for chronic abdominal pain.   Her concerns today include:  07/18/2023 per triage RN call note:  Chief Complaint: abdominal pain- bilateral lower quadrant pain- chronic Symptoms: chronic abdominal pain- moderate- especially worse since delivery of baby- 02/13/23, patient also complains of accompanying headache at times Frequency: chronic Pertinent Negatives: Patient denies back pain, diarrhea, fever, urination pain, vomiting  Disposition: [] ED /[] Urgent Care (no appt availability in office) / [x] Appointment(In office/virtual)/ []  [facility]/ [] Home Care/ [] Refused Recommended Disposition /[] [place] [facility]/ []  Follow-up with PCP Additional Notes: Patient is calling to report she is having increased pain - bilateral sides. Patient states she has discussed this pain with provider- but does not have diagnosis. Patient also state sshe has headaches, feels feverish at night and has runny nose. Patient has been scheduled at first available appointment with provider- she has also been given the location of mobile unit tomorrow because she has to go in am hours. Patient advised I would send message to provider to see if there is a sooner appointment in am. Triage call was difficult due to interpreter and background noise.   Summary: Abdominal pain, leg and head pain    abdominal pain, leg pain, head pain.Marland Kitchen.  (interpreter needed per Case manager Roberto Scales)          Interpreter: [phone]     Reason for Disposition  Abdominal pain is a chronic symptom (recurrent or ongoing AND present > 4 weeks)  Answer Assessment - Initial Assessment Questions 1. LOCATION: "Where does it hurt?"      Lower quadrant abdominal pain- bilateral 2. RADIATION: "Does the pain shoot anywhere else?" (e.g., chest,  back)     *No Answer* 3. ONSET: "When did the pain begin?" (e.g., minutes, hours or days ago)      After delivery of child- the pain was stronger- 02/13/23 4. SUDDEN: "Gradual or sudden onset?"     *No Answer* 5. PATTERN "Does the pain come and go, or is it constant?"    - If it comes and goes: "How long does it last?" "Do you have pain now?"     (Note: Comes and goes means the pain is intermittent. It goes away completely between bouts.)    - If constant: "Is it getting better, staying the same, or getting worse?"      (Note: Constant means the pain never goes away completely; most serious pain is constant and gets worse.)      Burning sensation after pain- followed by headache-comes and goes 6. SEVERITY: "How bad is the pain?"  (e.g., Scale 1-10; mild, moderate, or severe)    - MILD (1-3): Doesn't interfere with normal activities, abdomen soft and not tender to touch.     - MODERATE (4-7): Interferes with normal activities or awakens from sleep, abdomen tender to touch.     - SEVERE (8-10): Excruciating pain, doubled over, unable to do any normal activities.       7/10- moderate 7. RECURRENT SYMPTOM: "Have you ever had this type of stomach pain before?" If Yes, ask: "When was the last time?" and "What happened that time?"      No- ongoing- patient states PCP is aware 8. CAUSE: "What do you think is causing the stomach pain?"     Not sure 9. RELIEVING/AGGRAVATING FACTORS: "  What makes it better or worse?" (e.g., antacids, bending or twisting motion, bowel movement)     Activity has helped a little 10. OTHER SYMPTOMS: "Do you have any other symptoms?" (e.g., back pain, diarrhea, fever, urination pain, vomiting)       Headache,feels feverish at night- some sweating, runny nose  Protocols used: Abdominal Pain - Female-A-AH    Today's office visit 07/19/2023: - States she is unsure if abdominal pain is related to cesarean section in May 2024. Patient reports she has not been seen for  abdominal pain since began. Patient reports bilateral pain at sides of stomach and radiating to back. She denies red flag symptoms. - Denies upper respiratory symptoms/red flag symptoms.  - Intermittent headaches. Denies red flag symptoms.  - Reports Nexplanon of left arm causing pain. Denies red flag symptoms.   Patient Active Problem List   Diagnosis Date Noted   Mild anemia 02/15/2023   Nexplanon in place 02/15/2023   Status post repeat low transverse cesarean section 02/13/2023   Dehiscence of old uterine scar with extension before onset of labor, delivered 02/13/2023   Language barrier 08/30/2022   Abnormal Pap smear of cervix 08/30/2022     Current Outpatient Medications on File Prior to Visit  Medication Sig Dispense Refill   ferrous sulfate 325 (65 FE) MG tablet Take 1 tablet (325 mg total) by mouth every other day. 30 tablet 0   Prenatal 27-1 MG TABS Take 1 tablet by mouth daily. 30 tablet 11   No current facility-administered medications on file prior to visit.    Allergies  Allergen Reactions   Other     Egg Plants   Pork-Derived Products Other (See Comments)    Cultural/religious preference per pt   Strawberry Flavor     Social History   Socioeconomic History   Marital status: Married    Spouse name: Not on file   Number of children: Not on file   Years of education: Not on file   Highest education level: Not on file  Occupational History   Not on file  Tobacco Use   Smoking status: Never    Passive exposure: Never   Smokeless tobacco: Never  Vaping Use   Vaping status: Never Used  Substance and Sexual Activity   Alcohol use: Never   Drug use: Never   Sexual activity: Yes  Other Topics Concern   Not on file  Social History Narrative   Not on file   Social Determinants of Health   Financial Resource Strain: Not on file  Food Insecurity: No Food Insecurity (02/13/2023)   Hunger Vital Sign    Worried About Running Out of Food in the Last Year:  Never true    Ran Out of Food in the Last Year: Never true  Transportation Needs: No Transportation Needs (02/13/2023)   PRAPARE - Administrator, Civil Service (Medical): No    Lack of Transportation (Non-Medical): No  Physical Activity: Not on file  Stress: Not on file  Social Connections: Not on file  Intimate Partner Violence: Not At Risk (02/13/2023)   Humiliation, Afraid, Rape, and Kick questionnaire    Fear of Current or Ex-Partner: No    Emotionally Abused: No    Physically Abused: No    Sexually Abused: No    Family History  Problem Relation Age of Onset   Asthma Neg Hx    Cancer Neg Hx    Diabetes Neg Hx    Heart disease Neg  Hx    Hypertension Neg Hx     Past Surgical History:  Procedure Laterality Date   CESAREAN SECTION     3x   CESAREAN SECTION N/A 02/13/2023   Procedure: CESAREAN SECTION;  Surgeon: Warden Fillers, MD;  Location: [facility];  Service: Obstetrics;  Laterality: N/A;   NEXPLANON TRAY  02/15/2023    ROS: Review of Systems Negative except as stated above  PHYSICAL EXAM: BP (!) 133/91   Pulse 87   Temp 97.8 F (36.6 C) (Oral)   Ht 5\' 5"  (1.651 m)   Wt 151 lb 3.2 oz (68.6 kg)   LMP  (LMP Unknown)   SpO2 97%   BMI 25.16 kg/m   Physical Exam HENT:     Head: Normocephalic and atraumatic.     Nose: Nose normal.     Mouth/Throat:     Mouth: Mucous membranes are moist.     Pharynx: Oropharynx is clear.  Eyes:     Extraocular Movements: Extraocular movements intact.     Conjunctiva/sclera: Conjunctivae normal.     Pupils: Pupils are equal, round, and reactive to light.  Cardiovascular:     Rate and Rhythm: Normal rate and regular rhythm.     Pulses: Normal pulses.     Heart sounds: Normal heart sounds.  Pulmonary:     Effort: Pulmonary effort is normal.     Breath sounds: Normal breath sounds.  Abdominal:     General: Bowel sounds are normal.     Palpations: Abdomen is soft.     Comments: Cesarean surgical scar  clean/dry/intact.  Musculoskeletal:        General: Normal range of motion.     Right shoulder: Normal.     Left shoulder: Normal.     Right upper arm: Normal.     Left upper arm: Normal.     Right elbow: Normal.     Left elbow: Normal.     Right forearm: Normal.     Left forearm: Normal.     Right wrist: Normal.     Left wrist: Normal.     Right hand: Normal.     Left hand: Normal.     Cervical back: Normal, normal range of motion and neck supple.     Thoracic back: Normal.     Lumbar back: Normal.     Right hip: Normal.     Left hip: Normal.     Right upper leg: Normal.     Left upper leg: Normal.     Right knee: Normal.     Left knee: Normal.     Right lower leg: Normal.     Left lower leg: Normal.     Right ankle: Normal.     Left ankle: Normal.     Right foot: Normal.     Left foot: Normal.  Neurological:     General: No focal deficit present.     Mental Status: She is alert and oriented to person, place, and time.  Psychiatric:        Mood and Affect: Mood normal.        Behavior: Behavior normal.     ASSESSMENT AND PLAN: 1. Chronic abdominal pain - Routine screening.  - Ultrasound abdomen for evaluation. - Referral to Gastroenterology for evaluation/management.  - Referral to Gynecology for evaluation/management.  - Follow-up with primary provider as scheduled.  - CMP14+EGFR - CBC - Amylase - Lipase - POCT urine pregnancy; Future - POCT URINALYSIS DIP (  CLINITEK); Future - Cervicovaginal ancillary only - US Abdomen Complete; Future - Ambulatory referral to Gastroenterology - Ambulatory referral to Gynecology  2. Nonintractable headache, unspecified chronicity pattern, unspecified headache type - Trial over-the-counter Acetaminophen.  - Patient declined referral to Neurology.  - Follow-up with primary provider as scheduled.  3. Nexplanon in place - Referral to Gynecology for evaluation/management. - Ambulatory referral to Gynecology  4. Encounter  for screening for HIV - Routine screening.  - HIV antibody (with reflex)  5. Language barrier - [employer]. Name:  Jordan Likes ID#: [id number]    Patient was given the opportunity to ask questions.  Patient verbalized understanding of the plan and was able to repeat key elements of the plan. Patient was given clear instructions to go to Emergency Department or return to medical center if symptoms don't improve, worsen, or new problems develop.The patient verbalized understanding.   Orders Placed This Encounter  Procedures   US Abdomen Complete   CMP14+EGFR   CBC   Amylase   Lipase   HIV antibody (with reflex)   Ambulatory referral to Gastroenterology   Ambulatory referral to Gynecology   POCT urine pregnancy   POCT URINALYSIS DIP (CLINITEK)    Follow-up with primary provider as scheduled.  Rema Fendt, NP

## 2023-07-20 LAB — CMP14+EGFR
ALT: 24 [IU]/L (ref 0–32)
AST: 22 [IU]/L (ref 0–40)
Albumin: 4.4 g/dL (ref 3.9–4.9)
Alkaline Phosphatase: 99 [IU]/L (ref 44–121)
BUN/Creatinine Ratio: 16 (ref 9–23)
BUN: 9 mg/dL (ref 6–20)
Bilirubin Total: 0.3 mg/dL (ref 0.0–1.2)
CO2: 22 mmol/L (ref 20–29)
Calcium: 9.4 mg/dL (ref 8.7–10.2)
Chloride: 104 mmol/L (ref 96–106)
Creatinine, Ser: 0.58 mg/dL (ref 0.57–1.00)
Globulin, Total: 3.2 g/dL (ref 1.5–4.5)
Glucose: 90 mg/dL (ref 70–99)
Potassium: 4.3 mmol/L (ref 3.5–5.2)
Sodium: 142 mmol/L (ref 134–144)
Total Protein: 7.6 g/dL (ref 6.0–8.5)
eGFR: 119 mL/min/{1.73_m2} (ref >59)

## 2023-07-20 LAB — CBC
Hematocrit: 42.9 % (ref 34.0–46.6)
Hemoglobin: 13.8 g/dL (ref 11.1–15.9)
MCH: 29 pg (ref 26.6–33.0)
MCHC: 32.2 g/dL (ref 31.5–35.7)
MCV: 90 fL (ref 79–97)
Platelets: 315 10*3/uL (ref 150–450)
RBC: 4.76 x10E6/uL (ref 3.77–5.28)
RDW: 14 % (ref 11.7–15.4)
WBC: 6.9 10*3/uL (ref 3.4–10.8)

## 2023-07-20 LAB — CERVICOVAGINAL ANCILLARY ONLY
Bacterial Vaginitis (gardnerella): NEGATIVE
Candida Glabrata: NEGATIVE
Candida Vaginitis: NEGATIVE
Chlamydia: NEGATIVE
Comment: NEGATIVE
Comment: NEGATIVE
Comment: NEGATIVE
Comment: NEGATIVE
Comment: NEGATIVE
Comment: NORMAL
Neisseria Gonorrhea: NEGATIVE
Trichomonas: NEGATIVE

## 2023-07-20 LAB — AMYLASE: Amylase: 91 U/L (ref 31–110)

## 2023-07-20 LAB — HIV ANTIBODY (ROUTINE TESTING W REFLEX): HIV Screen 4th Generation wRfx: NONREACTIVE

## 2023-07-20 LAB — LIPASE: Lipase: 20 U/L (ref 14–72)

## 2023-07-26 ENCOUNTER — Ambulatory Visit
Admission: RE | Admit: 2023-07-26 | Discharge: 2023-07-26 | Disposition: A | Discharge status: Home / Self Care | Payer: Medicaid Other | Source: Ambulatory Visit | Attending: Family | Admitting: Family

## 2023-07-26 ENCOUNTER — Ambulatory Visit: Payer: Medicaid Other | Admitting: Family

## 2023-07-26 DIAGNOSIS — G8929 Other chronic pain: Secondary | ICD-10-CM

## 2023-07-27 ENCOUNTER — Other Ambulatory Visit: Payer: Self-pay | Admitting: Family

## 2023-07-27 DIAGNOSIS — K760 Fatty (change of) liver, not elsewhere classified: Secondary | ICD-10-CM

## 2023-08-08 ENCOUNTER — Ambulatory Visit: Payer: Medicaid Other | Admitting: Family

## 2023-09-06 ENCOUNTER — Encounter: Payer: Self-pay | Admitting: Internal Medicine

## 2023-10-05 ENCOUNTER — Encounter: Payer: Self-pay | Admitting: Obstetrics and Gynecology

## 2023-10-05 ENCOUNTER — Ambulatory Visit: Payer: Medicaid Other | Admitting: Obstetrics and Gynecology

## 2023-10-05 VITALS — BP 139/98 | HR 77 | Ht 65.0 in | Wt 147.2 lb

## 2023-10-05 DIAGNOSIS — Z3046 Encounter for surveillance of implantable subdermal contraceptive: Secondary | ICD-10-CM

## 2023-10-05 DIAGNOSIS — K760 Fatty (change of) liver, not elsewhere classified: Secondary | ICD-10-CM | POA: Diagnosis not present

## 2023-10-05 DIAGNOSIS — G8929 Other chronic pain: Secondary | ICD-10-CM

## 2023-10-05 DIAGNOSIS — R109 Unspecified abdominal pain: Secondary | ICD-10-CM | POA: Diagnosis not present

## 2023-10-05 NOTE — Progress Notes (Signed)
 [place] on 07/26/23:  IMPRESSION: Hepatic steatosis.   Note form Provider:  Call patient with update. Hepatic steatosis. Referral to Gastroenterology for evaluation/management. Expect call soon with appointment details

## 2023-10-05 NOTE — Progress Notes (Signed)
   GYNECOLOGY PROGRESS NOTE  History:  38 y.o. [id number] presents to [facility] for problem visit.  Reports chronic abdominal pain.Was seen by pcp and dx with fatty liver. Reports some pain with nexplanon , when lifting something heavy or sleeping on that side   The following portions of the patient's history were reviewed and updated as appropriate: allergies, current medications, past family history, past medical history, past social history, past surgical history and problem list. Last pap smear on 2023 was ASCUS, neg HPV  Health Maintenance Due  Topic Date Due   INFLUENZA VACCINE  04/27/2023   COVID-19 Vaccine (1 - 2024-25 season) Never done     Review of Systems:  Pertinent items are noted in HPI.   Objective:  Physical Exam Blood pressure (!) 139/98, pulse 77, height 5' 5 (1.651 m), weight 147 lb 3.2 oz (66.8 kg), currently breastfeeding. VS reviewed, nursing note reviewed,  Constitutional: well developed, well nourished, no distress HEENT: normocephalic CV: normal rate Pulm/chest wall: normal effort Breast Exam: deferred Abdomen: soft, right quadrant tenderness Neuro: alert and oriented x 3 Skin: warm, dry Psych: affect normal Pelvic exam: deferred   Assessment & Plan:  1. Chronic abdominal pain (Primary) 2. Hepatic steatosis Was seen 10/23 for abd pain, u/s on 10/30 revealed fatty liver, referral already placed to GI for follow up Missed appointment, encouraged importance of rescheduling appt  Discussed dietary/lifestyle changes   3. Encounter for surveillance of Nexplanon  subdermal contraceptive  In place and intact, precautions discussed when to follow up    Nidia Daring, FNP

## 2023-10-10 ENCOUNTER — Encounter: Payer: Self-pay | Admitting: Internal Medicine

## 2023-10-17 ENCOUNTER — Other Ambulatory Visit (INDEPENDENT_AMBULATORY_CARE_PROVIDER_SITE_OTHER): Payer: Medicaid Other

## 2023-10-17 ENCOUNTER — Encounter: Payer: Self-pay | Admitting: Internal Medicine

## 2023-10-17 ENCOUNTER — Ambulatory Visit: Payer: Medicaid Other | Admitting: Internal Medicine

## 2023-10-17 VITALS — BP 110/60 | HR 73 | Ht 65.0 in | Wt 146.0 lb

## 2023-10-17 DIAGNOSIS — R1013 Epigastric pain: Secondary | ICD-10-CM

## 2023-10-17 DIAGNOSIS — R208 Other disturbances of skin sensation: Secondary | ICD-10-CM | POA: Diagnosis not present

## 2023-10-17 DIAGNOSIS — K760 Fatty (change of) liver, not elsewhere classified: Secondary | ICD-10-CM

## 2023-10-17 DIAGNOSIS — K219 Gastro-esophageal reflux disease without esophagitis: Secondary | ICD-10-CM | POA: Diagnosis not present

## 2023-10-17 LAB — CBC WITH DIFFERENTIAL/PLATELET
Basophils Absolute: 0 10*3/uL (ref 0.0–0.1)
Basophils Relative: 0.6 % (ref 0.0–3.0)
Eosinophils Absolute: 0.1 10*3/uL (ref 0.0–0.7)
Eosinophils Relative: 1.6 % (ref 0.0–5.0)
HCT: 42.4 % (ref 36.0–46.0)
Hemoglobin: 14.1 g/dL (ref 12.0–15.0)
Lymphocytes Relative: 33.1 % (ref 12.0–46.0)
Lymphs Abs: 2.1 10*3/uL (ref 0.7–4.0)
MCHC: 33.2 g/dL (ref 30.0–36.0)
MCV: 89.1 fL (ref 78.0–100.0)
Monocytes Absolute: 0.3 10*3/uL (ref 0.1–1.0)
Monocytes Relative: 5.2 % (ref 3.0–12.0)
Neutro Abs: 3.7 10*3/uL (ref 1.4–7.7)
Neutrophils Relative %: 59.5 % (ref 43.0–77.0)
Platelets: 319 10*3/uL (ref 150.0–400.0)
RBC: 4.76 Mil/uL (ref 3.87–5.11)
RDW: 13 % (ref 11.5–15.5)
WBC: 6.2 10*3/uL (ref 4.0–10.5)

## 2023-10-17 LAB — VITAMIN B12: Vitamin B-12: 530 pg/mL (ref 211–911)

## 2023-10-17 LAB — HEPATIC FUNCTION PANEL
ALT: 15 U/L (ref 0–35)
AST: 16 U/L (ref 0–37)
Albumin: 4.7 g/dL (ref 3.5–5.2)
Alkaline Phosphatase: 79 U/L (ref 39–117)
Bilirubin, Direct: 0.1 mg/dL (ref 0.0–0.3)
Total Bilirubin: 0.4 mg/dL (ref 0.2–1.2)
Total Protein: 8 g/dL (ref 6.0–8.3)

## 2023-10-17 LAB — PROTIME-INR
INR: 1.4 {ratio} — ABNORMAL HIGH (ref 0.8–1.0)
Prothrombin Time: 14.9 s — ABNORMAL HIGH (ref 9.6–13.1)

## 2023-10-17 MED ORDER — FAMOTIDINE 40 MG PO TABS: ORAL_TABLET | ORAL | 5 refills | Status: AC

## 2023-10-17 NOTE — Patient Instructions (Signed)
Your provider has requested that you go to the basement level for lab work before leaving today. Press "B" on the elevator. The lab is located at the first door on the left as you exit the elevator.  Your provider has ordered "[facility]" stool testing for you. You have received a kit from our office today containing all necessary supplies to complete this test. Please carefully read the stool collection instructions provided in the kit before opening the accompanying materials. In addition, be sure to place the label from the top right corner of the laboratory request sheet onto the "puritan opti-swab" tube that is supplied in the kit. This label should include your full name and date of birth. After completing the test, you should secure the purtian tube into the specimen biohazard bag. The laboratory request information sheet (including date and time of specimen collection) should be placed into the outside pocket of the specimen biohazard bag and returned to the [facility] lab with 2 days of collection.   If the laboratory information sheet specimen date and time are not filled out, the test will NOT be performed.  We have sent the following medications to your pharmacy for you to pick up at your convenience: famotidine 40 mg daily x 1 month then as needed.   _______________________________________________________  If your blood pressure at your visit was 140/90 or greater, please contact your primary care physician to follow up on this.  _______________________________________________________  If you are age 39 or older, your body mass index should be between 23-30. Your Body mass index is 24.3 kg/m. If this is out of the aforementioned range listed, please consider follow up with your Primary Care Provider.  If you are age 75 or younger, your body mass index should be between 19-25. Your Body mass index is 24.3 kg/m. If this is out of the aformentioned range listed, please consider follow up with  your Primary Care Provider.   ________________________________________________________  The [facility] providers would like to encourage you to use [facility] to communicate with providers for non-urgent requests or questions.  Due to long hold times on the telephone, sending your provider a message by [facility] may be a faster and more efficient way to get a response.  Please allow 48 business hours for a response.  Please remember that this is for non-urgent requests.  _______________________________________________________

## 2023-10-17 NOTE — Progress Notes (Signed)
Patient ID: Michele Gomez, female   DOB: 1986-03-22, 38 y.o.   MRN: [id number] HPI: Discussed the use of AI scribe software for clinical note transcription with the patient, who gave verbal consent to proceed.  In person Arabic interpreter present during the entire encounter.    The patient, with a known diagnosis of fatty liver, presents with right upper quadrant abdominal pain, described as sharp and intermittent. The pain is noted to be worse after consumption of greasy or sour foods. The patient denies any associated nausea or vomiting and reports a good appetite. Bowel movements are reported to be irregular, with constipation sometimes occurring.  Usually goes daily but can be every other day for a bowel movement.  No lower abdominal pain.  No change in bowel habits.  The patient denies any blood in stool. There is no known family history of liver or gastrointestinal problems.  The patient also reports a burning sensation in both feet, which is worse at night. This is a new symptom and unrelated to the primary complaint of abdominal pain.  The patient is originally from [place] and is a mother of four children, the youngest being 34 old. Postpartum, the patient has noticed an increase in symptoms of reflux, which she attributes to decreased physical activity.  She also continues to breast-feed.  The patient also reports a feeling of bloating, particularly after eating certain foods. This bloating is not associated with any pain. The patient denies any other symptoms or health concerns at this time.  Past Medical History:  Diagnosis Date   Mild anemia 02/15/2023    Past Surgical History:  Procedure Laterality Date   CESAREAN SECTION     3x   CESAREAN SECTION N/A 02/13/2023   Procedure: CESAREAN SECTION;  Surgeon: Warden Fillers, MD;  Location: [facility] LD [facility];  Service: Obstetrics;  Laterality: N/A;   NEXPLANON TRAY  02/15/2023    Outpatient Medications Prior to Visit   Medication Sig Dispense Refill   Prenatal Vit-Fe Fumarate-FA (PRENATAL VITAMIN PO) Take by mouth. Take one daily     ferrous sulfate 325 (65 FE) MG tablet Take 1 tablet (325 mg total) by mouth every other day. (Patient not taking: Reported on 10/17/2023) 30 tablet 0   Prenatal 27-1 MG TABS Take 1 tablet by mouth daily. (Patient not taking: Reported on 10/05/2023) 30 tablet 11   No facility-administered medications prior to visit.    Allergies  Allergen Reactions   Other     Egg Plants   Pork-Derived Products Other (See Comments)    Cultural/religious preference per pt   Strawberry Flavoring Agent (Non-Screening)     Family History  Problem Relation Age of Onset   Asthma Neg Hx    Cancer Neg Hx    Diabetes Neg Hx    Heart disease Neg Hx    Hypertension Neg Hx     Social History   Tobacco Use   Smoking status: Never    Passive exposure: Never   Smokeless tobacco: Never  Vaping Use   Vaping status: Never Used  Substance Use Topics   Alcohol use: Never   Drug use: Never    ROS: As per history of present illness, otherwise negative  BP 110/60   Pulse 73   Ht 5\' 5"  (1.651 m)   Wt 146 lb (66.2 kg)   BMI 24.30 kg/m  Gen: awake, alert, NAD HEENT: anicteric  CV: RRR, no mrg Pulm: CTA b/l Abd: soft, NT/ND, +BS throughout,  no hepatosplenomegaly Ext: no c/c/e Neuro: nonfocal   RELEVANT LABS AND IMAGING: CBC    Component Value Date/Time   WBC 6.9 07/19/2023 0940   WBC 11.0 (H) 02/14/2023 0435   RBC 4.76 07/19/2023 0940   RBC 2.85 (L) 02/14/2023 0435   HGB 13.8 07/19/2023 0940   HCT 42.9 07/19/2023 0940   PLT 315 07/19/2023 0940   MCV 90 07/19/2023 0940   MCH 29.0 07/19/2023 0940   MCH 31.9 02/14/2023 0435   MCHC 32.2 07/19/2023 0940   MCHC 35.0 02/14/2023 0435   RDW 14.0 07/19/2023 0940   LYMPHSABS 1.4 08/04/2022 0919   EOSABS 0.0 08/04/2022 0919   BASOSABS 0.0 08/04/2022 0919    CMP     Component Value Date/Time   NA 142 07/19/2023 0940   K 4.3  07/19/2023 0940   CL 104 07/19/2023 0940   CO2 22 07/19/2023 0940   GLUCOSE 90 07/19/2023 0940   GLUCOSE 118 (H) 02/10/2023 0922   BUN 9 07/19/2023 0940   CREATININE 0.58 07/19/2023 0940   CALCIUM 9.4 07/19/2023 0940   PROT 7.6 07/19/2023 0940   ALBUMIN 4.4 07/19/2023 0940   AST 22 07/19/2023 0940   ALT 24 07/19/2023 0940   ALKPHOS 99 07/19/2023 0940   BILITOT 0.3 07/19/2023 0940   GFRNONAA >60 02/10/2023 0922   ABDOMEN ULTRASOUND COMPLETE   COMPARISON:  None Available.   FINDINGS: Gallbladder: No gallstones or wall thickening visualized. No sonographic Murphy sign noted by sonographer.   Common bile duct: Diameter: Visualized portion measures 2 mm, within normal limits.   Liver: No focal lesion identified. Diffusely increased in parenchymal echogenicity. Portal vein is patent on color Doppler imaging with normal direction of blood flow towards the liver.   IVC: No abnormality visualized.   Pancreas: Obscured by shadowing bowel gas.   Spleen: Size and appearance within normal limits.   Right Kidney: Length: 10.5 cm. Echogenicity within normal limits. No mass or hydronephrosis visualized.   Left Kidney: Poorly visualized secondary to limited acoustic window is an shadowing bowel gas. Length: 11.0 cm. Echogenicity is grossly within normal limits. No definitive mass or hydronephrosis visualized.   Abdominal aorta: No aneurysm visualized.   Other findings: None.   IMPRESSION: Hepatic steatosis.     Electronically Signed   By: Meda Klinefelter M.D.   On: 07/26/2023 16:54    ASSESSMENT/PLAN: 38 year old female with a history of mild anemia, prior abnormal Pap smear who is seen in consult at the request of Ricky Stabs, NP to evaluate hepatic steatosis and right upper quadrant pain.  She is here today with a medical Arabic interpreter.  Hepatic steatosis/right upper quadrant pain --we discussed fatty liver today.  She does not have elevation in serum  transaminases.  There is no evidence for portal hypertension or advanced liver disease.  We discussed how fatty liver is often metabolic but may also resolve postpartum as her weight returns to normal.  Healthy diet and exercise recommended.  Mediterranean-style diet. --No additional workup at this time for fatty liver; annual liver enzymes are recommended and if abnormal then she should come back to see [place] and we would likely also start Vitamin E at that time -- Repeat CMP, CBC and INR today  2.  Right upper quadrant pain/dyspepsia/heartburn --symptoms sound more acid peptic.  No dysphagia, nausea, vomiting or weight loss.  No family history of gastric cancer.  No evidence for gallstones -- H. pylori stool antigen -- Famotidine 40 mg every morning x 1  month; if symptoms resolve then no further evaluation recommended however she could use this as needed for heartburn or indigestion -- If persistent symptoms we could consider EGD  3.  Bilateral burning sensation in her feet --no history of diabetes. -- Check serum B12 -- Patient advised to discuss this symptom further at primary care follow-up  4.  Colon cancer screening --average risk, recommended age 42   YN:[id number], Amy J, Np [address] [place],  [place] [address]

## 2023-10-25 ENCOUNTER — Other Ambulatory Visit: Payer: Self-pay

## 2023-10-25 DIAGNOSIS — K760 Fatty (change of) liver, not elsewhere classified: Secondary | ICD-10-CM

## 2023-10-26 ENCOUNTER — Telehealth: Payer: Self-pay

## 2023-10-26 NOTE — Telephone Encounter (Signed)
Called interpreter services and spoke with Michele Gomez that will contact patient and inform her of H.Pylori results. Patient has been informed of H. Pylori results and to contact our office if her symptoms persist with the famotidine. Patient verbalized understanding.

## 2023-10-26 NOTE — Telephone Encounter (Signed)
Received fax results from [facility] that the H. Pylori stool test is negative. Results placed on Dr. Lauro Franklin desk for review.

## 2023-10-26 NOTE — Telephone Encounter (Signed)
Please let pt know her H. Pylori test is neg If symptoms persist with the famotidine she should let us know for additional recs at that time [facility]

## 2023-11-07 ENCOUNTER — Encounter: Payer: Self-pay | Admitting: Internal Medicine

## 2024-04-01 NOTE — Congregational Nurse Program (Signed)
 Client came to check blood pressure. Says she sometimes gets headaches and was up late with her baby last night.   BP was 134/93, Reviewed prior medical visits and BP was similar 07/19/23 and 10/05/23 but no blood pressure medication prescribed. Educated on good blood pressure range and asked her to return tomorrow to check again.   Would suggest rescheduling her PCP appointment upcoming in September to sooner to address blood pressure elevation.

## 2024-06-05 ENCOUNTER — Encounter: Payer: Self-pay | Admitting: Family

## 2024-06-05 ENCOUNTER — Ambulatory Visit (INDEPENDENT_AMBULATORY_CARE_PROVIDER_SITE_OTHER): Admitting: Family

## 2024-06-05 VITALS — BP 123/87 | HR 89 | Temp 98.7°F | Resp 16 | Ht 65.0 in | Wt 139.4 lb

## 2024-06-05 DIAGNOSIS — Z23 Encounter for immunization: Secondary | ICD-10-CM

## 2024-06-05 DIAGNOSIS — Z0000 Encounter for general adult medical examination without abnormal findings: Secondary | ICD-10-CM | POA: Diagnosis not present

## 2024-06-05 DIAGNOSIS — Z130 Encounter for screening for diseases of the blood and blood-forming organs and certain disorders involving the immune mechanism: Secondary | ICD-10-CM

## 2024-06-05 DIAGNOSIS — Z13228 Encounter for screening for other metabolic disorders: Secondary | ICD-10-CM

## 2024-06-05 DIAGNOSIS — Z13220 Encounter for screening for lipoid disorders: Secondary | ICD-10-CM

## 2024-06-05 DIAGNOSIS — Z1329 Encounter for screening for other suspected endocrine disorder: Secondary | ICD-10-CM

## 2024-06-05 DIAGNOSIS — Z131 Encounter for screening for diabetes mellitus: Secondary | ICD-10-CM

## 2024-06-05 DIAGNOSIS — Z603 Acculturation difficulty: Secondary | ICD-10-CM

## 2024-06-05 NOTE — Progress Notes (Signed)
Patient is having abdominal pain. 

## 2024-06-05 NOTE — Progress Notes (Signed)
 Patient ID: Michele Gomez, female    DOB: 18-Mar-1986  MRN: [id number]  CC: Annual Exam  Subjective: Michele Gomez is a 38 y.o. female who presents for annual exam.   Her concerns today include:  - Patient up to date on cervical cancer screening per [facility]. - Patient plans to return at later date for appointment to discuss abdominal pain.  Patient Active Problem List   Diagnosis Date Noted   Mild anemia 02/15/2023   Nexplanon  in place 02/15/2023   Status post repeat low transverse cesarean section 02/13/2023   Dehiscence of old uterine scar with extension before onset of labor, delivered 02/13/2023   Language barrier 08/30/2022   Abnormal Pap smear of cervix 08/30/2022     Current Outpatient Medications on File Prior to Visit  Medication Sig Dispense Refill   famotidine  (PEPCID ) 40 MG tablet Take one tablet by mouth daily x 1 month then as needed (Patient not taking: Reported on 06/05/2024) 30 tablet 5   ferrous sulfate  325 (65 FE) MG tablet Take 1 tablet (325 mg total) by mouth every other day. (Patient not taking: Reported on 10/17/2023) 30 tablet 0   Prenatal Vit-Fe Fumarate-FA (PRENATAL VITAMIN PO) Take by mouth. Take one daily (Patient not taking: Reported on 06/05/2024)     No current facility-administered medications on file prior to visit.    Allergies  Allergen Reactions   Other     Egg Plants   Pork-Derived Products Other (See Comments)    Cultural/religious preference per pt   Strawberry Flavoring Agent (Non-Screening)     Social History   Socioeconomic History   Marital status: Married    Spouse name: Not on file   Number of children: 4   Years of education: Not on file   Highest education level: Not on file  Occupational History   Not on file  Tobacco Use   Smoking status: Never    Passive exposure: Never   Smokeless tobacco: Never  Vaping Use   Vaping status: Never Used  Substance and Sexual Activity   Alcohol use: Never    Drug use: Never   Sexual activity: Yes  Other Topics Concern   Not on file  Social History Narrative   Not on file   Social Drivers of Health   Financial Resource Strain: Low Risk  (06/05/2024)   Overall Financial Resource Strain (CARDIA)    Difficulty of Paying Living Expenses: Not hard at all  Food Insecurity: No Food Insecurity (02/13/2023)   Hunger Vital Sign    Worried About Running Out of Food in the Last Year: Never true    Ran Out of Food in the Last Year: Never true  Transportation Needs: No Transportation Needs (02/13/2023)   PRAPARE - Administrator, Civil Service (Medical): No    Lack of Transportation (Non-Medical): No  Physical Activity: Sufficiently Active (06/05/2024)   Exercise Vital Sign    Days of Exercise per Week: 7 days    Minutes of Exercise per Session: 30 min  Stress: No Stress Concern Present (06/05/2024)   [employer] of Occupational Health - Occupational Stress Questionnaire    Feeling of Stress: Not at all  Social Connections: Moderately Integrated (06/05/2024)   Social Connection and Isolation Panel    Frequency of Communication with Friends and Family: More than three times a week    Frequency of Social Gatherings with Friends and Family: More than three times a week    Attends  Religious Services: More than 4 times per year    Active Member of Clubs or Organizations: No    Attends Banker Meetings: Never    Marital Status: Married  Catering manager Violence: Not At Risk (02/13/2023)   Humiliation, Afraid, Rape, and Kick questionnaire    Fear of Current or Ex-Partner: No    Emotionally Abused: No    Physically Abused: No    Sexually Abused: No    Family History  Problem Relation Age of Onset   Asthma Neg Hx    Cancer Neg Hx    Diabetes Neg Hx    Heart disease Neg Hx    Hypertension Neg Hx     Past Surgical History:  Procedure Laterality Date   CESAREAN SECTION     3x   CESAREAN SECTION N/A 02/13/2023    Procedure: CESAREAN SECTION;  Surgeon: Zina Jerilynn LABOR, MD;  Location: [facility];  Service: Obstetrics;  Laterality: N/A;   NEXPLANON  TRAY  02/15/2023    ROS: Review of Systems Negative except as stated above  PHYSICAL EXAM: BP 123/87   Pulse 89   Temp 98.7 F (37.1 C) (Oral)   Resp 16   Ht 5' 5 (1.651 m)   Wt 139 lb 6.4 oz (63.2 kg)   SpO2 98%   Breastfeeding Yes   BMI 23.20 kg/m   Physical Exam HENT:     Head: Normocephalic and atraumatic.     Right Ear: Tympanic membrane, ear canal and external ear normal.     Left Ear: Tympanic membrane, ear canal and external ear normal.     Nose: Nose normal.     Mouth/Throat:     Mouth: Mucous membranes are moist.     Pharynx: Oropharynx is clear.  Eyes:     Extraocular Movements: Extraocular movements intact.     Conjunctiva/sclera: Conjunctivae normal.     Pupils: Pupils are equal, round, and reactive to light.  Neck:     Thyroid: No thyroid mass, thyromegaly or thyroid tenderness.  Cardiovascular:     Rate and Rhythm: Normal rate and regular rhythm.     Pulses: Normal pulses.     Heart sounds: Normal heart sounds.  Pulmonary:     Effort: Pulmonary effort is normal.     Breath sounds: Normal breath sounds.  Chest:     Comments: Patient declined. Abdominal:     General: Bowel sounds are normal.     Palpations: Abdomen is soft.  Genitourinary:    Comments: Patient declined. Musculoskeletal:        General: Normal range of motion.     Right shoulder: Normal.     Left shoulder: Normal.     Right upper arm: Normal.     Left upper arm: Normal.     Right elbow: Normal.     Left elbow: Normal.     Right forearm: Normal.     Left forearm: Normal.     Right wrist: Normal.     Left wrist: Normal.     Right hand: Normal.     Left hand: Normal.     Cervical back: Normal, normal range of motion and neck supple.     Thoracic back: Normal.     Lumbar back: Normal.     Right hip: Normal.     Left hip: Normal.     Right  upper leg: Normal.     Left upper leg: Normal.     Right knee: Normal.  Left knee: Normal.     Right lower leg: Normal.     Left lower leg: Normal.     Right ankle: Normal.     Left ankle: Normal.     Right foot: Normal.     Left foot: Normal.  Skin:    General: Skin is warm and dry.     Capillary Refill: Capillary refill takes less than 2 seconds.  Neurological:     General: No focal deficit present.     Mental Status: She is alert and oriented to person, place, and time.  Psychiatric:        Mood and Affect: Mood normal.        Behavior: Behavior normal.    ASSESSMENT AND PLAN: 1. Annual physical exam (Primary) - Counseled on 150 minutes of exercise per week as tolerated, healthy eating (including decreased daily intake of saturated fats, cholesterol, added sugars, sodium), STI prevention, and routine healthcare maintenance.  2. Screening for metabolic disorder - Routine screening.  - CMP14+EGFR  3. Screening for deficiency anemia - Routine screening.  - CBC  4. Diabetes mellitus screening - Routine screening.  - Hemoglobin A1c  5. Screening cholesterol level - Routine screening.  - Lipid panel  6. Thyroid disorder screen - Routine screening.  - TSH  7. Immunization due - Administered. - Flu vaccine trivalent PF, 6mos and older(Flulaval,Afluria,Fluarix,Fluzone)  8. Language barrier - [employer]. ID#: [id number]    Patient was given the opportunity to ask questions.  Patient verbalized understanding of the plan and was able to repeat key elements of the plan. Patient was given clear instructions to go to Emergency Department or return to medical center if symptoms don't improve, worsen, or new problems develop.The patient verbalized understanding.   Orders Placed This Encounter  Procedures   Flu vaccine trivalent PF, 6mos and older(Flulaval,Afluria,Fluarix,Fluzone)   CBC   Lipid panel   CMP14+EGFR   Hemoglobin A1c   TSH    Return in about  1 year (around 06/05/2025) for Physical per patient preference.  Greig JINNY Drones, NP

## 2024-06-06 ENCOUNTER — Ambulatory Visit: Payer: Self-pay | Admitting: Family

## 2024-06-06 LAB — LIPID PANEL
Chol/HDL Ratio: 2.5 ratio (ref 0.0–4.4)
Cholesterol, Total: 124 mg/dL (ref 100–199)
HDL: 50 mg/dL (ref >39)
LDL Chol Calc (NIH): 63 mg/dL (ref 0–99)
Triglycerides: 48 mg/dL (ref 0–149)
VLDL Cholesterol Cal: 11 mg/dL (ref 5–40)

## 2024-06-06 LAB — CBC
Hematocrit: 45.2 % (ref 34.0–46.6)
Hemoglobin: 14.1 g/dL (ref 11.1–15.9)
MCH: 29 pg (ref 26.6–33.0)
MCHC: 31.2 g/dL — ABNORMAL LOW (ref 31.5–35.7)
MCV: 93 fL (ref 79–97)
Platelets: 322 x10E3/uL (ref 150–450)
RBC: 4.86 x10E6/uL (ref 3.77–5.28)
RDW: 13.5 % (ref 11.7–15.4)
WBC: 7 x10E3/uL (ref 3.4–10.8)

## 2024-06-06 LAB — CMP14+EGFR
ALT: 12 IU/L (ref 0–32)
AST: 17 IU/L (ref 0–40)
Albumin: 4.4 g/dL (ref 3.9–4.9)
Alkaline Phosphatase: 73 IU/L (ref 44–121)
BUN/Creatinine Ratio: 18 (ref 9–23)
BUN: 10 mg/dL (ref 6–20)
Bilirubin Total: 0.3 mg/dL (ref 0.0–1.2)
CO2: 23 mmol/L (ref 20–29)
Calcium: 9.4 mg/dL (ref 8.7–10.2)
Chloride: 106 mmol/L (ref 96–106)
Creatinine, Ser: 0.56 mg/dL — ABNORMAL LOW (ref 0.57–1.00)
Globulin, Total: 2.9 g/dL (ref 1.5–4.5)
Glucose: 92 mg/dL (ref 70–99)
Potassium: 4.4 mmol/L (ref 3.5–5.2)
Sodium: 142 mmol/L (ref 134–144)
Total Protein: 7.3 g/dL (ref 6.0–8.5)
eGFR: 120 mL/min/1.73 (ref >59)

## 2024-06-06 LAB — TSH: TSH: 0.61 u[IU]/mL (ref 0.450–4.500)

## 2024-06-06 LAB — HEMOGLOBIN A1C
Est. average glucose Bld gHb Est-mCnc: 117 mg/dL
Hgb A1c MFr Bld: 5.7 % — ABNORMAL HIGH (ref 4.8–5.6)

## 2024-09-04 ENCOUNTER — Ambulatory Visit (INDEPENDENT_AMBULATORY_CARE_PROVIDER_SITE_OTHER): Admitting: Family

## 2024-09-04 ENCOUNTER — Ambulatory Visit: Payer: Self-pay | Admitting: Family

## 2024-09-04 ENCOUNTER — Encounter: Payer: Self-pay | Admitting: Family

## 2024-09-04 VITALS — BP 144/97 | HR 78 | Temp 98.1°F | Resp 16 | Ht 65.0 in | Wt 138.2 lb

## 2024-09-04 DIAGNOSIS — R030 Elevated blood-pressure reading, without diagnosis of hypertension: Secondary | ICD-10-CM | POA: Diagnosis not present

## 2024-09-04 DIAGNOSIS — R109 Unspecified abdominal pain: Secondary | ICD-10-CM | POA: Diagnosis not present

## 2024-09-04 DIAGNOSIS — G629 Polyneuropathy, unspecified: Secondary | ICD-10-CM | POA: Diagnosis not present

## 2024-09-04 DIAGNOSIS — Z758 Other problems related to medical facilities and other health care: Secondary | ICD-10-CM

## 2024-09-04 DIAGNOSIS — Z603 Acculturation difficulty: Secondary | ICD-10-CM | POA: Diagnosis not present

## 2024-09-04 DIAGNOSIS — E569 Vitamin deficiency, unspecified: Secondary | ICD-10-CM | POA: Diagnosis not present

## 2024-09-04 LAB — POCT URINALYSIS DIP (CLINITEK)
Bilirubin, UA: NEGATIVE
Glucose, UA: NEGATIVE mg/dL
Ketones, POC UA: NEGATIVE mg/dL
Leukocytes, UA: NEGATIVE
Nitrite, UA: NEGATIVE
POC PROTEIN,UA: NEGATIVE
Spec Grav, UA: 1.02 (ref 1.010–1.025)
Urobilinogen, UA: 0.2 U/dL — AB
pH, UA: 7 (ref 5.0–8.0)

## 2024-09-04 LAB — POCT URINE PREGNANCY: Preg Test, Ur: NEGATIVE

## 2024-09-04 MED ORDER — MULTIVITAMIN ADULT PO TABS: 1.0000 | ORAL_TABLET | Freq: Every day | ORAL | 0 refills | Status: AC

## 2024-09-04 NOTE — Progress Notes (Signed)
 3 month follow up, right side abdominal pain

## 2024-09-04 NOTE — Progress Notes (Signed)
 Patient ID: Michele Gomez, female    DOB: 1986-09-13  MRN: [id number]  CC: Follow-Up  Subjective: Michele Gomez is a 38 y.o. female who presents for follow-up.  Her concerns today include:  - Intermittent right-sided abdominal pain for 4 months. Denies red flag symptoms. States worse in the mornings and after eating certain foods.  - States bilateral lower extremities feels like pins and needles. Denies red flag symptoms. States worse during nighttime and during certain weather.  - States would like prescription for daily vitamin. - Blood pressure check. States she did not get much sleep last night due to taking care of her baby. She does not complain of red flag symptoms such as but not limited to chest pain, shortness of breath, worst headache of life, nausea/vomiting.   Patient Active Problem List   Diagnosis Date Noted   Mild anemia 02/15/2023   Nexplanon  in place 02/15/2023   Status post repeat low transverse cesarean section 02/13/2023   Dehiscence of old uterine scar with extension before onset of labor, delivered 02/13/2023   Language barrier 08/30/2022   Abnormal Pap smear of cervix 08/30/2022     Current Outpatient Medications on File Prior to Visit  Medication Sig Dispense Refill   famotidine  (PEPCID ) 40 MG tablet Take one tablet by mouth daily x 1 month then as needed (Patient not taking: Reported on 06/05/2024) 30 tablet 5   ferrous sulfate  325 (65 FE) MG tablet Take 1 tablet (325 mg total) by mouth every other day. (Patient not taking: Reported on 10/17/2023) 30 tablet 0   Prenatal Vit-Fe Fumarate-FA (PRENATAL VITAMIN PO) Take by mouth. Take one daily (Patient not taking: Reported on 06/05/2024)     No current facility-administered medications on file prior to visit.    Allergies  Allergen Reactions   Other     Egg Plants   Porcine (Pork) Protein-Containing Drug Products Other (See Comments)    Cultural/religious preference per pt   Strawberry  Flavoring Agent (Non-Screening)     Social History   Socioeconomic History   Marital status: Married    Spouse name: Not on file   Number of children: 4   Years of education: Not on file   Highest education level: Not on file  Occupational History   Not on file  Tobacco Use   Smoking status: Never    Passive exposure: Never   Smokeless tobacco: Never  Vaping Use   Vaping status: Never Used  Substance and Sexual Activity   Alcohol use: Never   Drug use: Never   Sexual activity: Yes  Other Topics Concern   Not on file  Social History Narrative   Not on file   Social Drivers of Health   Financial Resource Strain: Low Risk  (06/05/2024)   Overall Financial Resource Strain (CARDIA)    Difficulty of Paying Living Expenses: Not hard at all  Food Insecurity: No Food Insecurity (02/13/2023)   Hunger Vital Sign    Worried About Running Out of Food in the Last Year: Never true    Ran Out of Food in the Last Year: Never true  Transportation Needs: No Transportation Needs (02/13/2023)   PRAPARE - Administrator, Civil Service (Medical): No    Lack of Transportation (Non-Medical): No  Physical Activity: Sufficiently Active (06/05/2024)   Exercise Vital Sign    Days of Exercise per Week: 7 days    Minutes of Exercise per Session: 30 min  Stress: No Stress  Concern Present (06/05/2024)   [employer] of Occupational Health - Occupational Stress Questionnaire    Feeling of Stress: Not at all  Social Connections: Moderately Integrated (06/05/2024)   Social Connection and Isolation Panel    Frequency of Communication with Friends and Family: More than three times a week    Frequency of Social Gatherings with Friends and Family: More than three times a week    Attends Religious Services: More than 4 times per year    Active Member of [employer] or Organizations: No    Attends Banker Meetings: Never    Marital Status: Married  Catering Manager Violence: Not At  Risk (02/13/2023)   Humiliation, Afraid, Rape, and Kick questionnaire    Fear of Current or Ex-Partner: No    Emotionally Abused: No    Physically Abused: No    Sexually Abused: No    Family History  Problem Relation Age of Onset   Asthma Neg Hx    Cancer Neg Hx    Diabetes Neg Hx    Heart disease Neg Hx    Hypertension Neg Hx     Past Surgical History:  Procedure Laterality Date   CESAREAN SECTION     3x   CESAREAN SECTION N/A 02/13/2023   Procedure: CESAREAN SECTION;  Surgeon: Zina Jerilynn LABOR, MD;  Location: [facility];  Service: Obstetrics;  Laterality: N/A;   NEXPLANON  TRAY  02/15/2023    ROS: Review of Systems Negative except as stated above  PHYSICAL EXAM: BP (!) 144/97   Pulse 78   Temp 98.1 F (36.7 C) (Oral)   Resp 16   Ht 5' 5 (1.651 m)   Wt 138 lb 3.2 oz (62.7 kg)   SpO2 97%   Breastfeeding Yes   BMI 23.00 kg/m   Physical Exam HENT:     Head: Normocephalic and atraumatic.     Nose: Nose normal.     Mouth/Throat:     Mouth: Mucous membranes are moist.     Pharynx: Oropharynx is clear.  Eyes:     Extraocular Movements: Extraocular movements intact.     Conjunctiva/sclera: Conjunctivae normal.     Pupils: Pupils are equal, round, and reactive to light.  Cardiovascular:     Rate and Rhythm: Normal rate and regular rhythm.     Pulses: Normal pulses.     Heart sounds: Normal heart sounds.  Pulmonary:     Effort: Pulmonary effort is normal.     Breath sounds: Normal breath sounds.  Abdominal:     General: Bowel sounds are normal.     Palpations: Abdomen is soft.  Musculoskeletal:        General: Normal range of motion.     Cervical back: Normal range of motion and neck supple.     Right hip: Normal.     Left hip: Normal.     Right upper leg: Normal.     Left upper leg: Normal.     Right knee: Normal.     Left knee: Normal.     Right lower leg: Normal.     Left lower leg: Normal.     Right ankle: Normal.     Left ankle: Normal.     Right  foot: Normal.     Left foot: Normal.  Neurological:     General: No focal deficit present.     Mental Status: She is alert and oriented to person, place, and time.  Psychiatric:  Mood and Affect: Mood normal.        Behavior: Behavior normal.     ASSESSMENT AND PLAN: 1. Abdominal pain, unspecified abdominal location (Primary) - Patient declined pharmacological therapy.  - Routine screening.  - Referral to Gastroenterology for evaluation/management. - Follow-up with primary provider as scheduled.  - Ambulatory referral to Gastroenterology - CMP14+EGFR - CBC - Amylase - Lipase - POCT URINALYSIS DIP (CLINITEK); Future - Urine Culture - POCT urine pregnancy; Future  2. Neuropathy - Patient declined pharmacological therapy.  - Referral to Neurology for evaluation/management.  - Follow-up with primary provider as scheduled.  - Ambulatory referral to Neurology  3. Vitamin deficiency - Multiple Vitamin as prescribed. Counseled on medication adherence/adverse effects.  - Follow-up with primary provider as scheduled.  - Multiple Vitamin (MULTIVITAMIN ADULT) TABS; Take 1 tablet by mouth daily.  Dispense: 90 tablet; Refill: 0  4. Elevated blood pressure reading - Blood pressure not at goal during today's visit. Patient asymptomatic without chest pressure, chest pain, palpitations, shortness of breath, worst headache of life, and any additional red flag symptoms. - Follow-up with primary provider in 4 weeks or sooner if needed.   5. Language barrier - [employer]. ID#: [id number]    Patient was given the opportunity to ask questions.  Patient verbalized understanding of the plan and was able to repeat key elements of the plan. Patient was given clear instructions to go to Emergency Department or return to medical center if symptoms don't improve, worsen, or new problems develop.The patient verbalized understanding.   Orders Placed This Encounter  Procedures   Urine  Culture   CMP14+EGFR   CBC   Amylase   Lipase   Ambulatory referral to Gastroenterology   Ambulatory referral to Neurology   POCT URINALYSIS DIP (CLINITEK)   POCT urine pregnancy     Requested Prescriptions   Signed Prescriptions Disp Refills   Multiple Vitamin (MULTIVITAMIN ADULT) TABS 90 tablet 0    Sig: Take 1 tablet by mouth daily.    Return in about 4 weeks (around 10/02/2024) for Follow-Up or next available blood pressure check.  Greig JINNY Chute, NP

## 2024-09-05 ENCOUNTER — Encounter: Payer: Self-pay | Admitting: Neurology

## 2024-09-05 LAB — CMP14+EGFR
ALT: 19 IU/L (ref 0–32)
AST: 23 IU/L (ref 0–40)
Albumin: 4.5 g/dL (ref 3.9–4.9)
Alkaline Phosphatase: 74 IU/L (ref 41–116)
BUN/Creatinine Ratio: 15 (ref 9–23)
BUN: 9 mg/dL (ref 6–20)
Bilirubin Total: 0.4 mg/dL (ref 0.0–1.2)
CO2: 23 mmol/L (ref 20–29)
Calcium: 9.3 mg/dL (ref 8.7–10.2)
Chloride: 104 mmol/L (ref 96–106)
Creatinine, Ser: 0.6 mg/dL (ref 0.57–1.00)
Globulin, Total: 3 g/dL (ref 1.5–4.5)
Glucose: 89 mg/dL (ref 70–99)
Potassium: 4.1 mmol/L (ref 3.5–5.2)
Sodium: 141 mmol/L (ref 134–144)
Total Protein: 7.5 g/dL (ref 6.0–8.5)
eGFR: 117 mL/min/1.73 (ref >59)

## 2024-09-05 LAB — CBC
Hematocrit: 44.6 % (ref 34.0–46.6)
Hemoglobin: 14.3 g/dL (ref 11.1–15.9)
MCH: 29.4 pg (ref 26.6–33.0)
MCHC: 32.1 g/dL (ref 31.5–35.7)
MCV: 92 fL (ref 79–97)
Platelets: 323 x10E3/uL (ref 150–450)
RBC: 4.86 x10E6/uL (ref 3.77–5.28)
RDW: 12.6 % (ref 11.7–15.4)
WBC: 6.1 x10E3/uL (ref 3.4–10.8)

## 2024-09-05 LAB — AMYLASE: Amylase: 96 U/L (ref 31–110)

## 2024-09-05 LAB — LIPASE: Lipase: 21 U/L (ref 14–72)

## 2024-09-06 LAB — URINE CULTURE: Organism ID, Bacteria: NO GROWTH

## 2024-10-30 NOTE — Progress Notes (Unsigned)
 "  Initial neurology clinic note  Reason for Evaluation: Consultation requested by Jaycee Greig PARAS, NP for an opinion regarding neuropathy. My final recommendations will be communicated back to the requesting physician by way of shared medical record or letter to requesting physician via [place]  mail.  HPI: This is Ms. Michele Gomez, a 39 y.o. ***-handed female with a medical history of pre-DM*** who presents to neurology clinic with the chief complaint of ***. The patient is accompanied by ***.  *** Pins and needles sensation in bilateral lower limbs Worse at night and certain weather Also referred to GI for abdominal pain for past 4-5 months  The patient has not*** had similar episodes of symptoms in the past. ***  Muscle bulk loss? *** Muscle pain? ***  Cramps/Twitching? *** Suggestion of myotonia/difficulty relaxing after contraction? ***  Fatigable weakness?*** Does strength improve after brief exercise?***  Able to brush hair/teeth without difficulty? *** Able to button shirts/use zips? *** Clumsiness/dropping grasped objects?*** Can you arise from squatted position easily? *** Able to get out of chair without using arms? *** Able to walk up steps easily? *** Use an assistive device to walk? *** Significant imbalance with walking? *** Falls?*** Any change in urine color, especially after exertion/physical activity? ***  The patient denies*** symptoms suggestive of oculobulbar weakness including diplopia, ptosis, dysphagia, poor saliva control, dysarthria/dysphonia, impaired mastication, facial weakness/droop.  There are no*** neuromuscular respiratory weakness symptoms, particularly orthopnea>dyspnea.   Pseudobulbar affect is absent***.  The patient does not*** report symptoms referable to autonomic dysfunction including impaired sweating, heat or cold intolerance, excessive mucosal dryness, gastroparetic early satiety, postprandial abdominal bloating, constipation,  bowel or bladder dyscontrol, erectile dysfunction*** or syncope/presyncope/orthostatic intolerance.  There are no*** complaints relating to other symptoms of small fiber modalities including paresthesia/pain.  The patient has not *** noticed any recent skin rashes nor does he*** report any constitutional symptoms like fever, night sweats, anorexia or unintentional weight loss.  EtOH use: ***  Restrictive diet? *** Family history of neuropathy/myopathy/NM disease?***  Previous labs, electrodiagnostics, and neuroimaging are summarized below, but pertinent findings include***  Any biopsy done? *** Current medications being tried for the patient's symptoms include ***  Prior medications that have been tried: ***   MEDICATIONS:  Outpatient Encounter Medications as of 11/07/2024  Medication Sig   famotidine  (PEPCID ) 40 MG tablet Take one tablet by mouth daily x 1 month then as needed (Patient not taking: Reported on 06/05/2024)   ferrous sulfate  325 (65 FE) MG tablet Take 1 tablet (325 mg total) by mouth every other day. (Patient not taking: Reported on 10/17/2023)   Multiple Vitamin (MULTIVITAMIN ADULT) TABS Take 1 tablet by mouth daily.   Prenatal Vit-Fe Fumarate-FA (PRENATAL VITAMIN PO) Take by mouth. Take one daily (Patient not taking: Reported on 06/05/2024)   No facility-administered encounter medications on file as of 11/07/2024.    PAST MEDICAL HISTORY: Past Medical History:  Diagnosis Date   Mild anemia 02/15/2023    PAST SURGICAL HISTORY: Past Surgical History:  Procedure Laterality Date   CESAREAN SECTION     3x   CESAREAN SECTION N/A 02/13/2023   Procedure: CESAREAN SECTION;  Surgeon: Zina Jerilynn LABOR, MD;  Location: [facility];  Service: Obstetrics;  Laterality: N/A;   NEXPLANON  TRAY  02/15/2023    ALLERGIES: Allergies[1]  FAMILY HISTORY: Family History  Problem Relation Age of Onset   Asthma Neg Hx    Cancer Neg Hx    Diabetes Neg Hx  Heart disease Neg Hx     Hypertension Neg Hx     SOCIAL HISTORY: Social History[2] Social History   Social History Narrative   Not on file     OBJECTIVE: PHYSICAL EXAM: There were no vitals taken for this visit.  General:*** General appearance: Awake and alert. No distress. Cooperative with exam.  Skin: No obvious rash or jaundice. HEENT: Atraumatic. Anicteric. Lungs: Non-labored breathing on room air  Heart: Regular Abdomen: Soft, non tender. Extremities: No edema. No obvious deformity.  Musculoskeletal: No obvious joint swelling. Psych: Affect appropriate.  Neurological: Mental Status: Alert. Speech fluent. No pseudobulbar affect Cranial Nerves: CNII: No RAPD. Visual fields grossly intact. CNIII, IV, VI: PERRL. No nystagmus. EOMI. CN V: Facial sensation intact bilaterally to fine touch. Masseter clench strong. Jaw jerk***. CN VII: Facial muscles symmetric and strong. No ptosis at rest or after sustained upgaze***. CN VIII: Hearing grossly intact bilaterally. CN IX: No hypophonia. CN X: Palate elevates symmetrically. CN XI: Full strength shoulder shrug bilaterally. CN XII: Tongue protrusion full and midline. No atrophy or fasciculations. No significant dysarthria*** Motor: Tone is ***. *** fasciculations in *** extremities. *** atrophy. No grip or percussive myotonia.***  Individual muscle group testing (MRC grade out of 5):  Movement     Neck flexion ***    Neck extension ***     Right Left   Shoulder abduction *** ***   Shoulder adduction *** ***   Shoulder ext rotation *** ***   Shoulder int rotation *** ***   Elbow flexion *** ***   Elbow extension *** ***   Wrist extension *** ***   Wrist flexion *** ***   Finger abduction - FDI *** ***   Finger abduction - ADM *** ***   Finger extension *** ***   Finger distal flexion - 2/3 *** ***   Finger distal flexion - 4/5 *** ***   Thumb flexion - FPL *** ***   Thumb abduction - APB *** ***    Hip flexion *** ***   Hip  extension *** ***   Hip adduction *** ***   Hip abduction *** ***   Knee extension *** ***   Knee flexion *** ***   Dorsiflexion *** ***   Plantarflexion *** ***   Inversion *** ***   Eversion *** ***   Great toe extension *** ***   Great toe flexion *** ***     Reflexes:  Right Left   Bicep *** ***   Tricep *** ***   BrRad *** ***   Knee *** ***   Ankle *** ***    Pathological Reflexes: Babinski: *** response bilaterally*** Hoffman: *** Troemner: *** Pectoral: *** Palmomental: *** Facial: *** Midline tap: *** Sensation: Pinprick: *** Vibration: *** Temperature: *** Proprioception: *** Coordination: Intact finger-to- nose-finger bilaterally. Romberg negative.*** Gait: Able to rise from chair with arms crossed unassisted. Normal, narrow-based gait. Able to tandem walk. Able to walk on toes and heels.***  Lab and Test Review: Internal labs: 09/04/24: Lipase wnl Amylase wnl CBC unremarkable CMP unremarkable  06/05/24: TSH wnl HbA1c: 5.7 Lipid panel: tChol 124, LDL 63, TG 48  B12 (10/17/23): 530  External labs: ***  Imaging/Procedures: *** CT head and cervical spine wo contrast (02/02/22): FINDINGS: CT HEAD FINDINGS   Brain: No evidence of acute infarction, hemorrhage, hydrocephalus, extra-axial collection or mass lesion/mass effect. Partially empty sella.   Vascular: No hyperdense vessel.   Skull: No acute fracture.   Sinuses/Orbits: Clear sinuses.  No acute orbital  findings.   Other: No mastoid effusions.   CT CERVICAL SPINE FINDINGS   Alignment: Reversal of the normal cervical lordosis. No substantial sagittal subluxation. Rotation of C1 on C2.   Skull base and vertebrae: Vertebral body heights are maintained. No acute fracture.   Soft tissues and spinal canal: No prevertebral fluid or swelling. No visible canal hematoma.   Disc levels:  No significant focal bony degenerative change.   Upper chest: Visualized lung apices are clear.    IMPRESSION: CT head:   1. No evidence of acute traumatic intracranial abnormality. 2. The cerebellar tonsils extend approximately 8-9 mm below the foramen magnum. Findings could represent a Chiari malformation, although the sequela of idiopathic intracranial hypertension is a consideration given a partially empty sella. Recommend MRI head to better characterize and further evaluate.   CT cervical spine:   1. No evidence of acute fracture. 2. Rotation of C1 on C2, probably positional in the absence of a fixed torticollis.  Lumbar spine xray (02/02/22): FINDINGS: Normal alignment. Vertebral body heights are maintained. Disc spaces are maintained. Negative for pars defect. Negative for fracture.   IMPRESSION: Negative.  Abdominal US  (07/26/23): IMPRESSION: Hepatic steatosis.  ASSESSMENT: Rhiann Boucher is a 39 y.o. female who presents for evaluation of ***. *** has a relevant medical history of ***. *** neurological examination is pertinent for ***. Available diagnostic data is significant for ***. This constellation of symptoms and objective data would most likely localize to ***. ***  PLAN: -Blood work: *** ***  -Return to clinic ***  The impression above as well as the plan as outlined below were extensively discussed with the patient (in the company of ***) who voiced understanding. All questions were answered to their satisfaction.  The patient was counseled on pertinent fall precautions per the printed material provided today, and as noted under the Patient Instructions section below.***  When available, results of the above investigations and possible further recommendations will be communicated to the patient via telephone/MyChart. Patient to call office if not contacted after expected testing turnaround time.   Total time spent reviewing records, interview, history/exam, documentation, and coordination of care on day of encounter:  *** min   Thank  you for allowing me to participate in patient's care.  If I can answer any additional questions, I would be pleased to do so.  Venetia Potters, MD   CC: Jaycee Greig PARAS, NP [address] [place] [address]  CC: Referring provider: Jaycee Greig PARAS, NP [address] [place],  [place] [address]    [1]  Allergies Allergen Reactions   Other     Egg Plants   Porcine (Pork) Protein-Containing Drug Products Other (See Comments)    Cultural/religious preference per pt   Strawberry Flavoring Agent (Non-Screening)   [2]  Social History Tobacco Use   Smoking status: Never    Passive exposure: Never   Smokeless tobacco: Never  Vaping Use   Vaping status: Never Used  Substance Use Topics   Alcohol use: Never   Drug use: Never   "

## 2024-11-07 ENCOUNTER — Ambulatory Visit: Admitting: Neurology
